# Patient Record
Sex: Male | Born: 1990 | Race: Black or African American | Hispanic: No | Marital: Single | Smoking: Never smoker
Health system: Southern US, Community
[De-identification: ages and names within clinical notes are randomized; demographics above are authoritative.]

## PROBLEM LIST (undated history)

## (undated) DIAGNOSIS — E860 Dehydration: Secondary | ICD-10-CM

## (undated) DIAGNOSIS — F418 Other specified anxiety disorders: Secondary | ICD-10-CM

## (undated) DIAGNOSIS — K920 Hematemesis: Secondary | ICD-10-CM

## (undated) DIAGNOSIS — K5900 Constipation, unspecified: Secondary | ICD-10-CM

## (undated) DIAGNOSIS — R05 Cough: Secondary | ICD-10-CM

## (undated) DIAGNOSIS — T7411XA Adult physical abuse, confirmed, initial encounter: Secondary | ICD-10-CM

## (undated) HISTORY — DX: Hematemesis: K92.0

## (undated) HISTORY — DX: Adult physical abuse, confirmed, initial encounter: T74.11XA

## (undated) HISTORY — DX: Dehydration: E86.0

## (undated) HISTORY — DX: Other specified anxiety disorders: F41.8

## (undated) HISTORY — PX: NO PAST SURGERIES: SHX2092

## (undated) HISTORY — DX: Cough: R05

---
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
## 2013-05-17 ENCOUNTER — Encounter (HOSPITAL_COMMUNITY): Payer: Self-pay | Admitting: Emergency Medicine

## 2013-05-17 ENCOUNTER — Emergency Department (INDEPENDENT_AMBULATORY_CARE_PROVIDER_SITE_OTHER)
Admission: EM | Admit: 2013-05-17 | Discharge: 2013-05-17 | Disposition: A | Discharge status: Home / Self Care | Payer: Managed Care, Other (non HMO) | Source: Home / Self Care | Attending: Family Medicine | Admitting: Family Medicine

## 2013-05-17 DIAGNOSIS — A088 Other specified intestinal infections: Secondary | ICD-10-CM

## 2013-05-17 DIAGNOSIS — A084 Viral intestinal infection, unspecified: Secondary | ICD-10-CM

## 2013-05-17 MED ORDER — LOPERAMIDE HCL 2 MG PO CAPS: 2.0000 mg | ORAL_CAPSULE | Freq: Four times a day (QID) | ORAL | Status: DC | PRN

## 2013-05-17 MED ORDER — ONDANSETRON 4 MG PO TBDP: 4.0000 mg | ORAL_TABLET | Freq: Four times a day (QID) | ORAL | Status: DC | PRN

## 2013-05-17 NOTE — ED Notes (Signed)
Pt c/o cold sxs onset Tuesday C/o having abd pain w/diarrhea and vomiting... Has vomited once today and once yest Also c/o feeling light headed and decreased appetite Denies: fevers Alert w/no signs of acuate distress.

## 2013-05-17 NOTE — ED Provider Notes (Signed)
Medical screening examination/treatment/procedure(s) were performed by resident physician or non-physician practitioner and as supervising physician I was immediately available for consultation/collaboration.   Evertte Sones DOUGLAS MD.   Saba Neuman D Carnelia Oscar, MD 05/17/13 1702 

## 2013-05-17 NOTE — ED Provider Notes (Signed)
CSN: [id number]     Arrival date & time 05/17/13  1121 History   First MD Initiated Contact with Patient 05/17/13 1232     Chief Complaint  Patient presents with  . URI   (Consider location/radiation/quality/duration/timing/severity/associated sxs/prior Treatment) HPI Comments: 22 year old male presents complaining of nausea, vomiting, diarrhea. This started 3 days ago with diarrhea. Last night, he began to feel nauseous and has vomited multiple times. Has not had an appetite but has been drinking lots of fluids and does not feel dehydrated. He did feel slightly lightheaded this morning. No blood in vomitus or diarrhea. No recent travel or sick contacts. No fever  Patient is a 22 y.o. male presenting with URI.  URI Presenting symptoms: no cough, no fatigue, no fever and no sore throat   Associated symptoms: no arthralgias, no myalgias and no neck pain     History reviewed. No pertinent past medical history. History reviewed. No pertinent past surgical history. No family history on file. History  Substance Use Topics  . Smoking status: Never Smoker   . Smokeless tobacco: Not on file  . Alcohol Use: Yes    Review of Systems  Constitutional: Negative for fever, chills and fatigue.  HENT: Negative for sore throat.   Eyes: Negative for visual disturbance.  Respiratory: Negative for cough and shortness of breath.   Cardiovascular: Negative for chest pain, palpitations and leg swelling.  Gastrointestinal: Positive for nausea, vomiting and diarrhea. Negative for abdominal pain, constipation, blood in stool and anal bleeding.  Genitourinary: Negative for dysuria, urgency, frequency and hematuria.  Musculoskeletal: Negative for arthralgias, myalgias, neck pain and neck stiffness.  Skin: Negative for rash.  Neurological: Negative for dizziness, weakness and light-headedness.    Allergies  Review of patient's allergies indicates no known allergies.  Home Medications   Current  Outpatient Rx  Name  Route  Sig  Dispense  Refill  . loperamide (IMODIUM) 2 MG capsule   Oral   Take 1 capsule (2 mg total) by mouth 4 (four) times daily as needed for diarrhea or loose stools.   12 capsule   0   . ondansetron (ZOFRAN-ODT) 4 MG disintegrating tablet   Oral   Take 1 tablet (4 mg total) by mouth every 6 (six) hours as needed for nausea. PRN for nausea or vomiting   12 tablet   0    BP 104/66  Pulse 84  Temp(Src) 98.9 F (37.2 C) (Oral)  Resp 16  SpO2 100% Physical Exam  Nursing note and vitals reviewed. Constitutional: He is oriented to person, place, and time. He appears well-developed and well-nourished. No distress.  HENT:  Head: Normocephalic.  Cardiovascular: Normal rate and regular rhythm.   Pulmonary/Chest: Effort normal. No respiratory distress.  Abdominal: Soft. There is no tenderness.  Neurological: He is alert and oriented to person, place, and time. Coordination normal.  Skin: Skin is warm and dry. No rash noted. He is not diaphoretic.  Psychiatric: He has a normal mood and affect. Judgment normal.    ED Course  Procedures (including critical care time) Labs Review Labs Reviewed - No data to display Imaging Review No results found.    MDM   1. Viral gastroenteritis    Treat with Zofran and Imodium. Followup if not improving as expected.   Meds ordered this encounter  Medications  . ondansetron (ZOFRAN-ODT) 4 MG disintegrating tablet    Sig: Take 1 tablet (4 mg total) by mouth every 6 (six) hours as needed for nausea. PRN  for nausea or vomiting    Dispense:  12 tablet    Refill:  0    Order Specific Question:  Supervising Provider    Answer:  Linna Hoff [phone]  . loperamide (IMODIUM) 2 MG capsule    Sig: Take 1 capsule (2 mg total) by mouth 4 (four) times daily as needed for diarrhea or loose stools.    Dispense:  12 capsule    Refill:  0    Order Specific Question:  Supervising Provider    Answer:  Bradd Canary D [[id number]]        Graylon Good, PA-C 05/17/13 1320

## 2013-07-29 ENCOUNTER — Encounter (HOSPITAL_COMMUNITY): Payer: Self-pay | Admitting: Emergency Medicine

## 2013-07-29 ENCOUNTER — Other Ambulatory Visit (HOSPITAL_COMMUNITY)
Admission: RE | Admit: 2013-07-29 | Discharge: 2013-07-29 | Disposition: A | Discharge status: Home / Self Care | Payer: Managed Care, Other (non HMO) | Source: Ambulatory Visit | Attending: Family Medicine | Admitting: Family Medicine

## 2013-07-29 ENCOUNTER — Emergency Department (INDEPENDENT_AMBULATORY_CARE_PROVIDER_SITE_OTHER)
Admission: EM | Admit: 2013-07-29 | Discharge: 2013-07-29 | Disposition: A | Discharge status: Home / Self Care | Payer: Managed Care, Other (non HMO) | Source: Home / Self Care | Attending: Family Medicine | Admitting: Family Medicine

## 2013-07-29 DIAGNOSIS — Z113 Encounter for screening for infections with a predominantly sexual mode of transmission: Secondary | ICD-10-CM | POA: Insufficient documentation

## 2013-07-29 DIAGNOSIS — N342 Other urethritis: Secondary | ICD-10-CM

## 2013-07-29 DIAGNOSIS — R197 Diarrhea, unspecified: Secondary | ICD-10-CM

## 2013-07-29 LAB — HIV ANTIBODY (ROUTINE TESTING W REFLEX): HIV: NONREACTIVE

## 2013-07-29 LAB — RPR: RPR: NONREACTIVE

## 2013-07-29 MED ORDER — LIDOCAINE HCL (PF) 1 % IJ SOLN
INTRAMUSCULAR | Status: AC
  Filled 2013-07-29: qty 5

## 2013-07-29 MED ORDER — AZITHROMYCIN 250 MG PO TABS
1000.0000 mg | ORAL_TABLET | Freq: Once | ORAL | Status: AC
  Administered 2013-07-29: 1000 mg via ORAL

## 2013-07-29 MED ORDER — CEFTRIAXONE SODIUM 250 MG IJ SOLR
INTRAMUSCULAR | Status: AC
  Filled 2013-07-29: qty 250

## 2013-07-29 MED ORDER — AZITHROMYCIN 250 MG PO TABS
ORAL_TABLET | ORAL | Status: AC
  Filled 2013-07-29: qty 4

## 2013-07-29 MED ORDER — CEFTRIAXONE SODIUM 250 MG IJ SOLR
250.0000 mg | Freq: Once | INTRAMUSCULAR | Status: AC
  Administered 2013-07-29: 250 mg via INTRAMUSCULAR

## 2013-07-29 NOTE — ED Notes (Signed)
C/o diarrhea for two months off/on states having problems since getting over stomach flu.  Pt aslo voices concerns for std check.  Noticed penile discharge yesterday.  Denies any pelvic or abdominal pain.

## 2013-07-29 NOTE — ED Provider Notes (Addendum)
Samuel JanskyKweisi D Townsend is a 23 y.o. male who presents to Urgent Care today for  1) diarrhea off-and-on for the past 2 months. Patient typically has loose stools in the mornings and normal stools during the day. He is approximately 3 or 4 bowel movements daily. He denies any bladder pain. He feels well otherwise. No significant change in his diet. [facility] was [facility] negative.  2) penile discharge. This is been present for one day. He denies any pain or burning with urination he feels well otherwise. He is concerned he may have contracted sex or transmitted infection...   He is a male who has sex with men. His last HIV test was about 3 months ago. He typically practices safe sex.   History reviewed. No pertinent past medical history. History  Substance Use Topics  . Smoking status: Never Smoker   . Smokeless tobacco: Not on file  . Alcohol Use: Yes   ROS as above Medications: No current facility-administered medications for this encounter.   Current Outpatient Prescriptions  Medication Sig Dispense Refill  . loperamide (IMODIUM) 2 MG capsule Take 1 capsule (2 mg total) by mouth 4 (four) times daily as needed for diarrhea or loose stools.  12 capsule  0    Exam:  BP 125/78  Pulse 64  Temp(Src) 98.2 F (36.8 C) (Oral)  Resp 16  SpO2 98% Gen: Well NAD HEENT: EOMI,  MMM Lungs: Normal work of breathing. CTABL Heart: RRR no MRG Abd: NABS, Soft. NT, ND Exts: Brisk capillary refill, warm and well perfused.  Genitals: No inguinal lymphadenopathy. Testicles are descended bilaterally and nontender.  Penis is circumcised and normal appearing without any lesions or masses. Mild clear discharge. Nontender.   Assessment and Plan: 23 y.o. male with  1) diarrhea: Unclear etiology. Recommended patient avoid lactose. Followup with primary care provider. We'll treat empirically with ceftriaxone and azithromycin 2) urethritis: Concerning for gonorrhea or  chlamydia. Urine cytology pending. Additionally have obtained HIV and RPR panel.  Discussed warning signs or symptoms. Please see discharge instructions. Patient expresses understanding.    Rodolph BongEvan S Jozlin Bently, MD 07/29/13 1140  Rodolph BongEvan S Ebrahim Deremer, MD 07/29/13 1140

## 2013-07-29 NOTE — ED Notes (Signed)
Will discharge at 11:35. Pt given injection and waiting blood draw.  Mw,cma

## 2013-07-29 NOTE — Discharge Instructions (Signed)
Thank you for coming in today. I will call you with the results if abnormal. Try avoiding milk products for a month. Get your stool culture results.  Followup with a regular Dr..  If your belly pain worsens, or you have high fever, bad vomiting, blood in your stool or black tarry stool go to the Emergency Room.   Chronic Diarrhea Diarrhea is frequent loose and watery bowel movements. It can cause you to feel weak and dehydrated. Dehydration can cause you to become tired and thirsty and to have a dry mouth, decreased urination, and dark yellow urine. Diarrhea is a sign of another problem, most often an infection that will not last long. In most cases, diarrhea lasts 2 3 days. Diarrhea that lasts longer than 4 weeks is called long-lasting (chronic) diarrhea. It is important to treat your diarrhea as directed by your health care provider to lessen or prevent future episodes of diarrhea.  CAUSES  There are many causes of chronic diarrhea. The following are some possible causes:   Gastrointestinal infections caused by viruses, bacteria, or parasites.   Food poisoning or food allergies.   Certain medicines, such as antibiotics, chemotherapy, and laxatives.   Artificial sweeteners and fructose.   Digestive disorders, such as celiac disease and inflammatory bowel diseases.   Irritable bowel syndrome.  Some disorders of the pancreas.  Disorders of the thyroid.  Reduced blood flow to the intestines.  Cancer. Sometimes the cause of chronic diarrhea is unknown. RISK FACTORS  Having a severely weakened immune system, such as from HIV or AIDS.   Taking certain types of cancer-fighting drugs (such as with chemotherapy) or other medicines.   Having had a recent organ transplant.   Having a portion of the stomach or small bowel removed.   Traveling to countries where food and water supplies are often contaminated.  SYMPTOMS  In addition to frequent, loose stools, diarrhea may  cause:   Cramping.   Abdominal pain.   Nausea.   Fever.  Fatigue.  Urgent need to use the bathroom.  Loss of bowel control. DIAGNOSIS  Your health care provider must take a careful history and perform a physical exam. Tests given are based on your symptoms and history. Tests may include:   Blood or stool tests. Three or more stool samples may be examined. Stool cultures may be used to test for bacteria or parasites.   X-rays.   A procedure in which a thin tube is inserted into the mouth or rectum (endoscopy). This allows the health care provider to look inside the intestine.  TREATMENT   Treatment is aimed at correcting the cause of the diarrhea when possible.  Diarrhea caused by an infection can often be treated with antibiotics.   Diarrhea not caused by an infection may require you to take long-term medicine or have surgery. Specific treatment should be discussed with your health care provider.   If the cause cannot be determined, treatment aims to relieve symptoms and prevent dehydration. Serious health problems can occur if you do not maintain proper fluid levels. Treatment may include:   Taking an oral rehydration solution (ORS) .  Not drinking beverages that contain caffeine (such as tea, coffee, and soft drinks).   Not drinking alcohol.   Maintaining well-balanced nutrition to help you recover faster. HOME CARE INSTRUCTIONS   Drink enough fluids to keep urine clear or pale yellow. Drink 1 cup (8 oz) of fluid for each diarrhea episode. Avoid fluids that contain simple sugars, fruit juices,  whole milk products, and sodas. Hydrate with an ORS. You may purchase the ORS or prepare it at home by mixing the following ingredients together:     tsp (1.7 3  mL) table salt.    tsp (3  mL) baking soda.    tsp (1.7 mL) salt substitute containing potassium chloride.   1 tbsp (20 mL) sugar.   4.2 c (1 L) of water.   Certain foods and beverages may  increase the speed at which food moves through the gastrointestinal (GI) tract. These foods and beverages should be avoided. They include:   Caffeinated and alcoholic beverages.   High-fiber foods, such as raw fruits and vegetables, nuts, seeds, and whole grain breads and cereals.   Foods and beverages sweetened with sugar alcohols, such as xylitol, sorbitol, and mannitol.   Some foods may be well tolerated and may help thicken stool. These include:  Starchy foods, such as rice, toast, pasta, low-sugar cereal, oatmeal, grits, baked potatoes, crackers, and bagels.   Bananas.   Applesauce.   Add probiotic-rich foods to help increase healthy bacteria in the GI tract. These include yogurt and fermented milk products.   Wash your hands well after each diarrhea episode.   Only take over-the-counter or prescription medicines as directed by your health care provider.   Take a warm bath to relieve any burning or pain from frequent diarrhea episodes. SEEK MEDICAL CARE IF:   You are not urinating as often.  Your urine is a dark color.   You become very tired or dizzy.   You have severe pain in the abdomen or rectum.   Your have blood or pus in your stools.   Your stools look black and tarry.  SEEK IMMEDIATE MEDICAL CARE IF:   You are unable to keep fluids down.   You have persistent vomiting.   You have blood in your stool.  Your stools are black and tarry.   You do not urinate in 6 8 hours, or there is only a small amount of very dark urine.   You have abdominal pain that increases or localizes.   You have weakness, dizziness, confusion, or lightheadedness.   You have a severe headache.   Your diarrhea gets worse or does not get better.   You have a fever or persistent symptoms for more than 2 3 days.   You have a fever and your symptoms suddenly get worse.  MAKE SURE YOU:   Understand these instructions.  Will watch your  condition.  Will get help right away if you are not doing well or get worse. Document Released: 09/03/2003 Document Revised: 02/13/2013 Document Reviewed: 12/06/2012 [facility] Patient Information 2014 [place], [place].

## 2013-07-31 ENCOUNTER — Telehealth (HOSPITAL_COMMUNITY): Payer: Self-pay | Admitting: *Deleted
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
## 2013-07-31 NOTE — ED Notes (Signed)
GC and Trich neg., Chlamydia pos., HIV/RPR non-reactive.  Pt. adequately treated with Zithromax and Rocephin.  I called pt. and left a  Message to call. Call 1.  DHHS [facility]. Vassie MoselleYork, Nezzie Manera M 07/31/2013

## 2013-08-23 ENCOUNTER — Emergency Department (HOSPITAL_COMMUNITY)
Admission: EM | Admit: 2013-08-23 | Discharge: 2013-08-24 | Disposition: A | Discharge status: Home / Self Care | Payer: Managed Care, Other (non HMO) | Attending: Emergency Medicine | Admitting: Emergency Medicine

## 2013-08-23 DIAGNOSIS — Y9389 Activity, other specified: Secondary | ICD-10-CM | POA: Insufficient documentation

## 2013-08-23 DIAGNOSIS — T148XXA Other injury of unspecified body region, initial encounter: Secondary | ICD-10-CM

## 2013-08-23 DIAGNOSIS — IMO0002 Reserved for concepts with insufficient information to code with codable children: Secondary | ICD-10-CM | POA: Insufficient documentation

## 2013-08-23 DIAGNOSIS — Y92410 Unspecified street and highway as the place of occurrence of the external cause: Secondary | ICD-10-CM | POA: Insufficient documentation

## 2013-08-23 DIAGNOSIS — S0990XA Unspecified injury of head, initial encounter: Secondary | ICD-10-CM | POA: Insufficient documentation

## 2013-08-24 ENCOUNTER — Encounter (HOSPITAL_COMMUNITY): Payer: Self-pay | Admitting: Emergency Medicine

## 2013-08-24 ENCOUNTER — Emergency Department (HOSPITAL_COMMUNITY): Payer: Managed Care, Other (non HMO)

## 2013-08-24 MED ORDER — IBUPROFEN 800 MG PO TABS
800.0000 mg | ORAL_TABLET | Freq: Once | ORAL | Status: AC
  Administered 2013-08-24: 800 mg via ORAL
  Filled 2013-08-24: qty 1

## 2013-08-24 MED ORDER — IBUPROFEN 800 MG PO TABS: 800.0000 mg | ORAL_TABLET | Freq: Three times a day (TID) | ORAL | Status: DC
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
## 2013-08-24 NOTE — ED Provider Notes (Signed)
Medical screening examination/treatment/procedure(s) were performed by non-physician practitioner and as supervising physician I was immediately available for consultation/collaboration.   Kwasi Joung, MD 08/24/13 0616 
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
## 2013-11-28 ENCOUNTER — Emergency Department (INDEPENDENT_AMBULATORY_CARE_PROVIDER_SITE_OTHER)
Admission: EM | Admit: 2013-11-28 | Discharge: 2013-11-28 | Disposition: A | Discharge status: Home / Self Care | Payer: Managed Care, Other (non HMO) | Source: Home / Self Care

## 2013-11-28 ENCOUNTER — Other Ambulatory Visit (HOSPITAL_COMMUNITY)
Admission: RE | Admit: 2013-11-28 | Discharge: 2013-11-28 | Disposition: A | Discharge status: Home / Self Care | Payer: Managed Care, Other (non HMO) | Source: Ambulatory Visit | Attending: Emergency Medicine | Admitting: Emergency Medicine

## 2013-11-28 ENCOUNTER — Encounter (HOSPITAL_COMMUNITY): Payer: Self-pay | Admitting: Emergency Medicine

## 2013-11-28 DIAGNOSIS — Z113 Encounter for screening for infections with a predominantly sexual mode of transmission: Secondary | ICD-10-CM | POA: Insufficient documentation

## 2013-11-28 DIAGNOSIS — R300 Dysuria: Secondary | ICD-10-CM

## 2013-11-28 LAB — POCT URINALYSIS DIP (DEVICE)
BILIRUBIN URINE: NEGATIVE
Glucose, UA: NEGATIVE mg/dL
Hgb urine dipstick: NEGATIVE
KETONES UR: NEGATIVE mg/dL
Leukocytes, UA: NEGATIVE
Nitrite: NEGATIVE
PH: 7 (ref 5.0–8.0)
PROTEIN: 30 mg/dL — AB
SPECIFIC GRAVITY, URINE: 1.025 (ref 1.005–1.030)
Urobilinogen, UA: 0.2 mg/dL (ref 0.0–1.0)

## 2013-11-28 LAB — HIV ANTIBODY (ROUTINE TESTING W REFLEX): HIV 1&2 Ab, 4th Generation: NONREACTIVE

## 2013-11-28 MED ORDER — CEPHALEXIN 500 MG PO CAPS: 500.0000 mg | ORAL_CAPSULE | Freq: Three times a day (TID) | ORAL | Status: DC

## 2013-11-28 NOTE — ED Provider Notes (Signed)
  Chief Complaint   Chief Complaint  Patient presents with  . Exposure to STD    History of Present Illness   Samuel Townsend is a 23 year old male who has had a three-day history of mild dysuria and some tingling around the urethra. He denies any discharge. The patient has had a history of Chlamydia and gonorrhea, but states he has not been sexually active in over a month. He denies any penile lesions, inguinal adenopathy, testicular pain, tenderness, mass, or abdominal pain. He has had no fever, chills, sore throat, redness of the eyes, skin rash, or joint pain.  Review of Systems   Other than as noted above, the patient denies any of the following symptoms: General:  No fever or chills. GI:  No abdominal pain, back pain, nausea, or vomiting. GU: Hematuria, urethral discharge, penile lesions, penile pain, testicular pain, swelling, or mass, or inguinal lymphadenopathy.  PMFSH   Past medical history, family history, social history, meds, and allergies were reviewed.    Physical Exam    Vital signs:  BP 119/72  Pulse 71  Temp(Src) 98 F (36.7 C) (Oral)  Resp 12  SpO2 100% Gen:  Alert, oriented, in no distress. Lungs:  Clear to auscultation, no wheezes, rales or rhonchi. Heart:  Regular rhythm, no gallop or murmer. Abdomen:  Flat and soft.  No tenderness to palpation, guarding, or rebound.  No hepato-splenomegaly or mass.  Bowel sounds were normally active.  No hernia. Genital exam:  Normal exam. No urethral discharge. No penile lesions, no inguinal adenopathy, testes are nontender without any masses. Back:  No CVA tenderness.  Skin:  Clear, warm and dry.  Labs   Results for orders placed during the hospital encounter of 11/28/13  POCT URINALYSIS DIP (DEVICE)      Result Value Ref Range   Glucose, UA NEGATIVE  NEGATIVE mg/dL   Bilirubin Urine NEGATIVE  NEGATIVE   Ketones, ur NEGATIVE  NEGATIVE mg/dL   Specific Gravity, Urine 1.025  1.005 - 1.030   Hgb urine dipstick  NEGATIVE  NEGATIVE   pH 7.0  5.0 - 8.0   Protein, ur 30 (*) NEGATIVE mg/dL   Urobilinogen, UA 0.2  0.0 - 1.0 mg/dL   Nitrite NEGATIVE  NEGATIVE   Leukocytes, UA NEGATIVE  NEGATIVE    Urine culture obtained as well as urine DNA probes for gonorrhea, Chlamydia, and Trichomonas. At patient's request an HIV serology was also obtained.  Assessment   The encounter diagnosis was Dysuria.   Differential diagnosis is urethritis versus cystitis.  Plan   1.  Meds:  The following meds were prescribed:   New Prescriptions   CEPHALEXIN (KEFLEX) 500 MG CAPSULE    Take 1 capsule (500 mg total) by mouth 3 (three) times daily.    2.  Patient Education/Counseling:  The patient was given appropriate handouts, self care instructions, and instructed in symptomatic relief.  The patient was told he would be called if any of the tests come back positive.  3.  Follow up:  The patient was told to follow up here if no better in 3 to 4 days, or sooner if becoming worse in any way, and given some red flag symptoms such as fever, persistent vomiting, pain, or difficulty urinating which would prompt immediate return.        Reuben Likes, MD 11/28/13 [phone]

## 2013-11-28 NOTE — ED Notes (Signed)
Pt    Reports   Symptoms  Of        tigngling  Sensation  On  Urination  With  Some  Burning on  Urination as  Well  With  Symptoms  For  sev  Days   Pt  Reports   No  Discharge          No   Lesions           Had  Std  5-6 months  Ago   Was  Treated

## 2013-11-28 NOTE — Discharge Instructions (Signed)

## 2013-11-29 ENCOUNTER — Telehealth (HOSPITAL_COMMUNITY): Payer: Self-pay | Admitting: Emergency Medicine

## 2013-11-29 MED ORDER — AZITHROMYCIN 500 MG PO TABS: 1000.0000 mg | ORAL_TABLET | Freq: Every day | ORAL | Status: DC

## 2013-11-29 NOTE — ED Notes (Signed)
The patient's DNA probe came back positive for Chlamydia, negative for gonorrhea. His HIV test was negative. His urine culture is still pending. The patient was called and informed of this positive result. He was informed to let all his sexual partners in the last 3 months know about this and make sure that they get treated and tested as well. A prescription will be sent into the [facility] pharmacy on [address] for azithromycin 500 mg, #2, to take 2 tablets at one time. This will be reported to the health department.  Reuben Likes, MD 11/29/13 2119

## 2013-11-30 LAB — URINE CULTURE
COLONY COUNT: NO GROWTH
Culture: NO GROWTH
Special Requests: NORMAL

## 2013-12-01 NOTE — ED Notes (Signed)
Urine culture: No growth.  Samuel Townsend 12/01/2013

## 2013-12-01 NOTE — ED Notes (Signed)
Patient informed of chlamydia report findings, DHHS form [phone] faxed to [facility]. UA culture still pending

## 2014-03-13 ENCOUNTER — Encounter (HOSPITAL_COMMUNITY): Payer: Self-pay | Admitting: Emergency Medicine

## 2014-03-13 ENCOUNTER — Emergency Department (INDEPENDENT_AMBULATORY_CARE_PROVIDER_SITE_OTHER)
Admission: EM | Admit: 2014-03-13 | Discharge: 2014-03-13 | Disposition: A | Discharge status: Home / Self Care | Payer: Managed Care, Other (non HMO) | Source: Home / Self Care

## 2014-03-13 DIAGNOSIS — R0982 Postnasal drip: Secondary | ICD-10-CM

## 2014-03-13 DIAGNOSIS — J9801 Acute bronchospasm: Secondary | ICD-10-CM

## 2014-03-13 MED ORDER — ALBUTEROL SULFATE HFA 108 (90 BASE) MCG/ACT IN AERS: INHALATION_SPRAY | RESPIRATORY_TRACT | Status: DC

## 2014-03-13 MED ORDER — PREDNISONE 20 MG PO TABS: ORAL_TABLET | ORAL | Status: DC

## 2014-03-13 NOTE — ED Provider Notes (Signed)
CSN: [id number]     Arrival date & time 03/13/14  1547 History   First MD Initiated Contact with Patient 03/13/14 1556     Chief Complaint  Patient presents with  . URI   (Consider location/radiation/quality/duration/timing/severity/associated sxs/prior Treatment) HPI Comments: C/o dry cough for 3 weeks. No relieved by Delsym. Denies PND, Earache, sore throat or fever.    History reviewed. No pertinent past medical history. History reviewed. No pertinent past surgical history. No family history on file. History  Substance Use Topics  . Smoking status: Never Smoker   . Smokeless tobacco: Not on file  . Alcohol Use: Yes    Review of Systems  Constitutional: Negative for fever, diaphoresis, activity change and fatigue.  HENT: Negative for ear pain, facial swelling, postnasal drip, rhinorrhea, sore throat and trouble swallowing.   Eyes: Negative for pain, discharge and redness.  Respiratory: Positive for cough. Negative for chest tightness and shortness of breath.   Cardiovascular: Negative.   Gastrointestinal: Negative.   Musculoskeletal: Negative.  Negative for neck pain and neck stiffness.  Neurological: Negative.     Allergies  Coconut fatty acids  Home Medications   Prior to Admission medications   Medication Sig Start Date End Date Taking? Authorizing Provider  Dextromethorphan Polistirex (DELSYM PO) Take by mouth.   Yes Historical Provider, MD  albuterol (PROVENTIL HFA;VENTOLIN HFA) 108 (90 BASE) MCG/ACT inhaler Inhale 2 puffs into lungs q 4h prn cough or wheeze 03/13/14   Hayden Rasmussen, NP  ibuprofen (ADVIL,MOTRIN) 800 MG tablet Take 1 tablet (800 mg total) by mouth 3 (three) times daily. 08/24/13   Phill Mutter Dammen, PA-C  predniSONE (DELTASONE) 20 MG tablet Take 3 tabs po on first day, 2 tabs second day, 2 tabs third day, 1 tab fourth day, 1 tab 5th day. Take with food. 03/13/14   Hayden Rasmussen, NP   BP 132/71  Temp(Src) 99.1 F (37.3 C) (Oral)  Resp 18  SpO2 96% Physical  Exam  Nursing note and vitals reviewed. Constitutional: He is oriented to person, place, and time. He appears well-developed and well-nourished. No distress.  HENT:  Right Ear: External ear normal.  Left Ear: External ear normal.  Mouth/Throat: No oropharyngeal exudate.  OP with minor erythema and curtains of frothy white PND   Eyes: Conjunctivae and EOM are normal.  Neck: Normal range of motion. Neck supple.  Cardiovascular: Normal rate, regular rhythm and normal heart sounds.   Pulmonary/Chest: Effort normal and breath sounds normal. No respiratory distress.  End expiratory wheeze with forced expiration and cough. Good air movement.  Musculoskeletal: Normal range of motion. He exhibits no edema.  Lymphadenopathy:    He has no cervical adenopathy.  Neurological: He is alert and oriented to person, place, and time.  Skin: Skin is warm and dry. No rash noted.  Psychiatric: He has a normal mood and affect.    ED Course  Procedures (including critical care time) Labs Review Labs Reviewed - No data to display  Imaging Review No results found.   MDM   1. Cough due to bronchospasm   2. PND (post-nasal drip)     Albuterol HFA as dir Allegra daily Prednisone taper dose Lots of liquids      Hayden Rasmussen, NP 03/13/14 1616

## 2014-03-13 NOTE — Discharge Instructions (Signed)
Bronchospasm Also use Allegra 60 mg twice a day for drainage A bronchospasm is when the tubes that carry air in and out of your lungs (airways) spasm or tighten. During a bronchospasm it is hard to breathe. This is because the airways get smaller. A bronchospasm can be triggered by:  Allergies. These may be to animals, pollen, food, or mold.  Infection. This is a common cause of bronchospasm.  Exercise.  Irritants. These include pollution, cigarette smoke, strong odors, aerosol sprays, and paint fumes.  Weather changes.  Stress.  Being emotional. HOME CARE   Always have a plan for getting help. Know when to call your doctor and local emergency services (911 in the [place] Know where you can get emergency care.  Only take medicines as told by your doctor.  If you were prescribed an inhaler or nebulizer machine, ask your doctor how to use it correctly. Always use a spacer with your inhaler if you were given one.  Stay calm during an attack. Try to relax and breathe more slowly.  Control your home environment:  Change your heating and air conditioning filter at least once a month.  Limit your use of fireplaces and wood stoves.  Do not  smoke. Do not  allow smoking in your home.  Avoid perfumes and fragrances.  Get rid of pests (such as roaches and mice) and their droppings.  Throw away plants if you see mold on them.  Keep your house clean and dust free.  Replace carpet with wood, tile, or vinyl flooring. Carpet can trap dander and dust.  Use allergy-proof pillows, mattress covers, and box spring covers.  Wash bed sheets and blankets every week in hot water. Dry them in a dryer.  Use blankets that are made of polyester or cotton.  Wash hands frequently. GET HELP IF:  You have muscle aches.  You have chest pain.  The thick spit you spit or cough up (sputum) changes from clear or white to yellow, green, gray, or bloody.  The thick spit you spit or cough up gets  thicker.  There are problems that may be related to the medicine you are given such as:  A rash.  Itching.  Swelling.  Trouble breathing. GET HELP RIGHT AWAY IF:  You feel you cannot breathe or catch your breath.  You cannot stop coughing.  Your treatment is not helping you breathe better.  You have very bad chest pain. MAKE SURE YOU:   Understand these instructions.  Will watch your condition.  Will get help right away if you are not doing well or get worse. Document Released: 04/10/2009 Document Revised: 06/18/2013 Document Reviewed: 12/04/2012 [facility] Patient Information 2015 [place], [place]. This information is not intended to replace advice given to you by your health care provider. Make sure you discuss any questions you have with your health care provider.  How to Use an Inhaler Using your inhaler correctly is very important. Good technique will make sure that the medicine reaches your lungs.  HOW TO USE AN INHALER: 1. Take the cap off the inhaler. 2. If this is the first time using your inhaler, you need to prime it. Shake the inhaler for 5 seconds. Release four puffs into the air, away from your face. Ask your doctor for help if you have questions. 3. Shake the inhaler for 5 seconds. 4. Turn the inhaler so the bottle is above the mouthpiece. 5. Put your pointer finger on top of the bottle. Your thumb holds the bottom  of the inhaler. 6. Open your mouth. 7. Either hold the inhaler away from your mouth (the width of 2 fingers) or place your lips tightly around the mouthpiece. Ask your doctor which way to use your inhaler. 8. Breathe out as much air as possible. 9. Breathe in and push down on the bottle 1 time to release the medicine. You will feel the medicine go in your mouth and throat. 10. Continue to take a deep breath in very slowly. Try to fill your lungs. 11. After you have breathed in completely, hold your breath for 10 seconds. This will help the medicine to  settle in your lungs. If you cannot hold your breath for 10 seconds, hold it for as long as you can before you breathe out. 12. Breathe out slowly, through pursed lips. Whistling is an example of pursed lips. 13. If your doctor has told you to take more than 1 puff, wait at least 15-30 seconds between puffs. This will help you get the best results from your medicine. Do not use the inhaler more than your doctor tells you to. 14. Put the cap back on the inhaler. 15. Follow the directions from your doctor or from the inhaler package about cleaning the inhaler. If you use more than one inhaler, ask your doctor which inhalers to use and what order to use them in. Ask your doctor to help you figure out when you will need to refill your inhaler.  If you use a steroid inhaler, always rinse your mouth with water after your last puff, gargle and spit out the water. Do not swallow the water. GET HELP IF:  The inhaler medicine only partially helps to stop wheezing or shortness of breath.  You are having trouble using your inhaler.  You have some increase in thick spit (phlegm). GET HELP RIGHT AWAY IF:  The inhaler medicine does not help your wheezing or shortness of breath or you have tightness in your chest.  You have dizziness, headaches, or fast heart rate.  You have chills, fever, or night sweats.  You have a large increase of thick spit, or your thick spit is bloody. MAKE SURE YOU:   Understand these instructions.  Will watch your condition.  Will get help right away if you are not doing well or get worse. Document Released: 03/22/2008 Document Revised: 04/03/2013 Document Reviewed: 01/10/2013 [facility] Patient Information 2015 [place], [place]. This information is not intended to replace advice given to you by your health care provider. Make sure you discuss any questions you have with your health care provider.

## 2014-03-13 NOTE — ED Provider Notes (Signed)
Medical screening examination/treatment/procedure(s) were performed by non-physician practitioner and as supervising physician I was immediately available for consultation/collaboration.  Leslee Home, M.D.  Reuben Likes, MD 03/13/14 [phone]

## 2014-03-13 NOTE — ED Notes (Signed)
Patient has c/o dry cough that started about three weeks ago.  Denies any sputum production, denies sore throat, denies ear pain

## 2014-08-19 ENCOUNTER — Emergency Department (HOSPITAL_COMMUNITY)
Admission: EM | Admit: 2014-08-19 | Discharge: 2014-08-19 | Disposition: A | Discharge status: Home / Self Care | Payer: 59 | Source: Home / Self Care | Attending: Family Medicine | Admitting: Family Medicine

## 2014-08-19 ENCOUNTER — Other Ambulatory Visit (HOSPITAL_COMMUNITY)
Admission: RE | Admit: 2014-08-19 | Discharge: 2014-08-19 | Disposition: A | Discharge status: Home / Self Care | Payer: 59 | Source: Ambulatory Visit | Attending: Family Medicine | Admitting: Family Medicine

## 2014-08-19 ENCOUNTER — Encounter (HOSPITAL_COMMUNITY): Payer: Self-pay | Admitting: *Deleted

## 2014-08-19 DIAGNOSIS — R369 Urethral discharge, unspecified: Secondary | ICD-10-CM

## 2014-08-19 DIAGNOSIS — Z113 Encounter for screening for infections with a predominantly sexual mode of transmission: Secondary | ICD-10-CM | POA: Insufficient documentation

## 2014-08-19 MED ORDER — AZITHROMYCIN 250 MG PO TABS
1000.0000 mg | ORAL_TABLET | Freq: Once | ORAL | Status: AC
  Administered 2014-08-19: 1000 mg via ORAL

## 2014-08-19 MED ORDER — LIDOCAINE HCL (PF) 1 % IJ SOLN
INTRAMUSCULAR | Status: AC
  Filled 2014-08-19: qty 5

## 2014-08-19 MED ORDER — AZITHROMYCIN 250 MG PO TABS
ORAL_TABLET | ORAL | Status: AC
  Filled 2014-08-19: qty 4

## 2014-08-19 MED ORDER — CEFTRIAXONE SODIUM 250 MG IJ SOLR
INTRAMUSCULAR | Status: AC
  Filled 2014-08-19: qty 250

## 2014-08-19 MED ORDER — CEFTRIAXONE SODIUM 250 MG IJ SOLR
250.0000 mg | Freq: Once | INTRAMUSCULAR | Status: AC
  Administered 2014-08-19: 250 mg via INTRAMUSCULAR

## 2014-08-19 NOTE — ED Notes (Signed)
Pt  Has  Symptoms  Of  A  Discharge  From his  Penis  As   Well  As  Tingling  When  He  Urinates         Pt  Reports    Symptoms  X   1  Week

## 2014-08-19 NOTE — ED Provider Notes (Signed)
CSN: [id number]     Arrival date & time 08/19/14  1525 History   First MD Initiated Contact with Patient 08/19/14 1544     Chief Complaint  Patient presents with  . SEXUALLY TRANSMITTED DISEASE   (Consider location/radiation/quality/duration/timing/severity/associated sxs/prior Treatment) HPI Comments: 24 year old male complaining of a penile discharge for 6 days. Denies associated symptoms such as burning or other urinary symptoms. Denies lesions, testicular or scrotal pain or swelling.   History reviewed. No pertinent past medical history. History reviewed. No pertinent past surgical history. History reviewed. No pertinent family history. History  Substance Use Topics  . Smoking status: Never Smoker   . Smokeless tobacco: Not on file  . Alcohol Use: Yes    Review of Systems  Genitourinary: Positive for discharge. Negative for dysuria, urgency, penile swelling, scrotal swelling, penile pain and testicular pain.       As per history of present illness  All other systems reviewed and are negative.   Allergies  Coconut fatty acids  Home Medications   Prior to Admission medications   Not on File   BP 122/78 mmHg  Pulse 72  Temp(Src) 98.6 F (37 C) (Oral)  Resp 14  SpO2 100% Physical Exam  Constitutional: He is oriented to person, place, and time. He appears well-developed and well-nourished. No distress.  Pulmonary/Chest: Effort normal. No respiratory distress.  Genitourinary: No penile tenderness.  Neurological: He is alert and oriented to person, place, and time.  Skin: Skin is warm and dry.  Psychiatric: He has a normal mood and affect.  Nursing note and vitals reviewed.   ED Course  Procedures (including critical care time) Labs Review Labs Reviewed  URINE CYTOLOGY ANCILLARY ONLY    Imaging Review No results found.   MDM   1. Abnormal penile discharge    Urine ancillary testing pending Rocephin 250 mg IM Azithromycin 1 g by mouth   Hayden Rasmussenavid Rylei Codispoti,  NP 08/19/14 1607

## 2014-08-20 LAB — URINE CYTOLOGY ANCILLARY ONLY
Chlamydia: NEGATIVE
Neisseria Gonorrhea: NEGATIVE
Trichomonas: NEGATIVE

## 2014-10-09 ENCOUNTER — Encounter (HOSPITAL_COMMUNITY): Payer: Self-pay | Admitting: Emergency Medicine

## 2014-10-09 ENCOUNTER — Other Ambulatory Visit (HOSPITAL_COMMUNITY)
Admission: RE | Admit: 2014-10-09 | Discharge: 2014-10-09 | Disposition: A | Discharge status: Home / Self Care | Payer: 59 | Source: Ambulatory Visit | Attending: Family Medicine | Admitting: Family Medicine

## 2014-10-09 ENCOUNTER — Emergency Department (HOSPITAL_COMMUNITY)
Admission: EM | Admit: 2014-10-09 | Discharge: 2014-10-09 | Disposition: A | Discharge status: Home / Self Care | Payer: 59 | Source: Home / Self Care | Attending: Family Medicine | Admitting: Family Medicine

## 2014-10-09 DIAGNOSIS — Z202 Contact with and (suspected) exposure to infections with a predominantly sexual mode of transmission: Secondary | ICD-10-CM

## 2014-10-09 DIAGNOSIS — Z113 Encounter for screening for infections with a predominantly sexual mode of transmission: Secondary | ICD-10-CM | POA: Insufficient documentation

## 2014-10-09 LAB — POCT URINALYSIS DIP (DEVICE)
Bilirubin Urine: NEGATIVE
GLUCOSE, UA: NEGATIVE mg/dL
Hgb urine dipstick: NEGATIVE
KETONES UR: NEGATIVE mg/dL
Leukocytes, UA: NEGATIVE
Nitrite: NEGATIVE
Protein, ur: 30 mg/dL — AB
Specific Gravity, Urine: >=1.03 (ref 1.005–1.030)
Urobilinogen, UA: 0.2 mg/dL (ref 0.0–1.0)
pH: 6.5 (ref 5.0–8.0)

## 2014-10-09 MED ORDER — PHENAZOPYRIDINE HCL 100 MG PO TABS: 100.0000 mg | ORAL_TABLET | Freq: Three times a day (TID) | ORAL | Status: DC | PRN

## 2014-10-09 MED ORDER — AZITHROMYCIN 250 MG PO TABS: 1000.0000 mg | ORAL_TABLET | Freq: Once | ORAL | Status: DC

## 2014-10-09 MED ORDER — CEFTRIAXONE SODIUM 250 MG IJ SOLR
INTRAMUSCULAR | Status: AC
  Filled 2014-10-09: qty 250

## 2014-10-09 MED ORDER — CEFTRIAXONE SODIUM 250 MG IJ SOLR
250.0000 mg | Freq: Once | INTRAMUSCULAR | Status: AC
  Administered 2014-10-09: 250 mg via INTRAMUSCULAR

## 2014-10-09 MED ORDER — AZITHROMYCIN 250 MG PO TABS
ORAL_TABLET | ORAL | Status: AC
  Filled 2014-10-09: qty 4

## 2014-10-09 MED ORDER — LIDOCAINE HCL (PF) 1 % IJ SOLN
INTRAMUSCULAR | Status: AC
  Filled 2014-10-09: qty 5

## 2014-10-09 NOTE — ED Notes (Signed)
Reports being treated for std's 2 months ago, pt came in c/o penile discharge.   Pt test result all came back negative.   Denies any unprotected intercourse within the time frame of being seen last.   Mild pelvic discomfort.  Nausea.  And d/c is also noticed with urinating.     No fever.

## 2014-10-09 NOTE — ED Provider Notes (Signed)
CSN: [id number]     Arrival date & time 10/09/14  0849 History   First MD Initiated Contact with Patient 10/09/14 0945     Chief Complaint  Patient presents with  . Penile Discharge   (Consider location/radiation/quality/duration/timing/severity/associated sxs/prior Treatment) HPI  Developed penile pain and discharge 2 mo ago. Daily. Getting worse. Treated for Gonorrhea and chlamydia on 08/19/14. Resolved then started again 2 days ago. Sexually active w/ condoms every time, but does not use condoms with oral sex. Sexual contacts without known STD symptoms. Denies fevers, nausea, vomiting, abdominal pain, frequency, back pain, rash, lymphadenopathy, unintentional weight loss, oral lesions.. Tested for HIV recently which was negative.    History reviewed. No pertinent past medical history. History reviewed. No pertinent past surgical history. Family History  Problem Relation Age of Onset  . Diabetes Neg Hx   . Cancer Neg Hx   . Heart failure Neg Hx   . Hyperlipidemia Neg Hx   . Hypertension Neg Hx    History  Substance Use Topics  . Smoking status: Never Smoker   . Smokeless tobacco: Not on file  . Alcohol Use: Yes    Review of Systems Per HPI with all other pertinent systems negative.   Allergies  Coconut fatty acids  Home Medications   Prior to Admission medications   Medication Sig Start Date End Date Taking? Authorizing Provider  phenazopyridine (PYRIDIUM) 100 MG tablet Take 1-2 tablets (100-200 mg total) by mouth 3 (three) times daily as needed for pain. 10/09/14   Ozella Rocksavid J Kriss Perleberg, MD   BP 151/83 mmHg  Pulse 80  Temp(Src) 98.6 F (37 C)  Resp 16  SpO2 100% Physical Exam Physical Exam  Constitutional: oriented to person, place, and time. appears well-developed and well-nourished. No distress.  HENT:  Head: Normocephalic and atraumatic.  Eyes: EOMI. PERRL.  Neck: Normal range of motion.  Cardiovascular: RRR, no m/r/g, 2+ distal pulses,  Pulmonary/Chest: Effort  normal and breath sounds normal. No respiratory distress.  Abdominal: Soft. Bowel sounds are normal. NonTTP, no distension.  Musculoskeletal: Normal range of motion. Non ttp, no effusion.  Neurological: alert and oriented to person, place, and time.  Skin: Skin is warm. No rash noted. non diaphoretic.  Psychiatric: normal mood and affect. behavior is normal. Judgment and thought content normal.  Genital: penis nml, circumcised, no discharge. No groin lymphadenopathy    ED Course  Procedures (including critical care time) Labs Review Labs Reviewed  POCT URINALYSIS DIP (DEVICE) - Abnormal; Notable for the following:    Protein, ur 30 (*)    All other components within normal limits  HIV ANTIBODY (ROUTINE TESTING)  RPR    Imaging Review No results found.   MDM   1. STD exposure    Recent HIV testing normal. This was a concern this patient has had multiple rounds of gonorrhea treated recommend patient return in 2 weeks for test of cure. Likely transmitting through oral sex. Azithromycin 1000 mg by mouth and ceftriaxone 250 mg IM administered in clinic. Pt provided w/ pyridium Rx for dysuria. UA fairly unremarkable, urine culture sent.    Ozella Rocksavid J Sonyia Muro, MD 10/09/14 1001

## 2014-10-09 NOTE — Discharge Instructions (Signed)
Your symptoms are likely due to a sexually transmitted disease. This probably transmitted due to oral sex. You were treated with the proper medicines in our clinic. We will call you with the results of your lab work. Your sexual contacts will need to be treated as well.

## 2014-10-10 LAB — URINE CYTOLOGY ANCILLARY ONLY
Chlamydia: NEGATIVE
NEISSERIA GONORRHEA: NEGATIVE
TRICH (WINDOWPATH): NEGATIVE

## 2014-11-18 DIAGNOSIS — F418 Other specified anxiety disorders: Secondary | ICD-10-CM | POA: Insufficient documentation

## 2014-11-18 DIAGNOSIS — R636 Underweight: Secondary | ICD-10-CM | POA: Insufficient documentation

## 2014-11-18 HISTORY — DX: Other specified anxiety disorders: F41.8

## 2014-11-18 HISTORY — DX: Underweight: R63.6

## 2014-12-11 DIAGNOSIS — R636 Underweight: Secondary | ICD-10-CM

## 2014-12-11 DIAGNOSIS — F418 Other specified anxiety disorders: Secondary | ICD-10-CM

## 2014-12-11 DIAGNOSIS — Z7251 High risk heterosexual behavior: Secondary | ICD-10-CM

## 2014-12-22 ENCOUNTER — Ambulatory Visit: Payer: 59 | Admitting: Internal Medicine

## 2015-12-16 ENCOUNTER — Emergency Department (HOSPITAL_COMMUNITY)
Admission: EM | Admit: 2015-12-16 | Discharge: 2015-12-16 | Disposition: A | Discharge status: Home / Self Care | Payer: 59 | Attending: Dermatology | Admitting: Dermatology

## 2015-12-16 ENCOUNTER — Encounter (HOSPITAL_COMMUNITY): Payer: Self-pay | Admitting: Emergency Medicine

## 2015-12-16 DIAGNOSIS — R197 Diarrhea, unspecified: Secondary | ICD-10-CM | POA: Insufficient documentation

## 2015-12-16 DIAGNOSIS — R05 Cough: Secondary | ICD-10-CM | POA: Insufficient documentation

## 2015-12-16 DIAGNOSIS — R112 Nausea with vomiting, unspecified: Secondary | ICD-10-CM | POA: Insufficient documentation

## 2015-12-16 DIAGNOSIS — R6883 Chills (without fever): Secondary | ICD-10-CM | POA: Insufficient documentation

## 2015-12-16 DIAGNOSIS — Z5321 Procedure and treatment not carried out due to patient leaving prior to being seen by health care provider: Secondary | ICD-10-CM | POA: Insufficient documentation

## 2015-12-16 LAB — CBC
HCT: 43.6 % (ref 39.0–52.0)
HEMOGLOBIN: 14.6 g/dL (ref 13.0–17.0)
MCH: 27.5 pg (ref 26.0–34.0)
MCHC: 33.5 g/dL (ref 30.0–36.0)
MCV: 82.1 fL (ref 78.0–100.0)
Platelets: 249 10*3/uL (ref 150–400)
RBC: 5.31 MIL/uL (ref 4.22–5.81)
RDW: 12.4 % (ref 11.5–15.5)
WBC: 5.5 10*3/uL (ref 4.0–10.5)

## 2015-12-16 LAB — URINALYSIS, ROUTINE W REFLEX MICROSCOPIC
Bilirubin Urine: NEGATIVE
Glucose, UA: NEGATIVE mg/dL
Hgb urine dipstick: NEGATIVE
KETONES UR: NEGATIVE mg/dL
Leukocytes, UA: NEGATIVE
NITRITE: NEGATIVE
Protein, ur: NEGATIVE mg/dL
Specific Gravity, Urine: 1.01 (ref 1.005–1.030)
pH: 7 (ref 5.0–8.0)

## 2015-12-16 LAB — COMPREHENSIVE METABOLIC PANEL
ALBUMIN: 4.2 g/dL (ref 3.5–5.0)
ALK PHOS: 42 U/L (ref 38–126)
ALT: 18 U/L (ref 17–63)
ANION GAP: 7 (ref 5–15)
AST: 30 U/L (ref 15–41)
BILIRUBIN TOTAL: 1 mg/dL (ref 0.3–1.2)
BUN: 8 mg/dL (ref 6–20)
CALCIUM: 9.3 mg/dL (ref 8.9–10.3)
CO2: 25 mmol/L (ref 22–32)
Chloride: 106 mmol/L (ref 101–111)
Creatinine, Ser: 0.99 mg/dL (ref 0.61–1.24)
GFR calc Af Amer: >60 mL/min (ref >60)
GLUCOSE: 112 mg/dL — AB (ref 65–99)
Potassium: 3.4 mmol/L — ABNORMAL LOW (ref 3.5–5.1)
Sodium: 138 mmol/L (ref 135–145)
TOTAL PROTEIN: 7 g/dL (ref 6.5–8.1)

## 2015-12-16 LAB — LIPASE, BLOOD: Lipase: 27 U/L (ref 11–51)

## 2015-12-16 NOTE — ED Notes (Signed)
Pt. reports persistent nausea , emesis and diarrhea with chills and dry cough onset today .

## 2015-12-16 NOTE — ED Notes (Signed)
Pt called several times for room, no response.

## 2015-12-17 ENCOUNTER — Ambulatory Visit (INDEPENDENT_AMBULATORY_CARE_PROVIDER_SITE_OTHER): Payer: 59 | Admitting: Family Medicine

## 2015-12-17 VITALS — BP 116/68 | HR 75 | Temp 98.0°F | Resp 17 | Ht 74.0 in | Wt 129.0 lb

## 2015-12-17 DIAGNOSIS — R05 Cough: Secondary | ICD-10-CM | POA: Diagnosis not present

## 2015-12-17 DIAGNOSIS — R059 Cough, unspecified: Secondary | ICD-10-CM

## 2015-12-17 MED ORDER — ALBUTEROL SULFATE HFA 108 (90 BASE) MCG/ACT IN AERS: 2.0000 | INHALATION_SPRAY | RESPIRATORY_TRACT | Status: DC | PRN

## 2015-12-17 NOTE — Progress Notes (Signed)
   Subjective:    Patient ID: Samuel JanskyKweisi D Strout, male    DOB: Nov 01, 1990, 25 y.o.   MRN: [id number]  HPI This is a pleasant 25 yo male who presents today with 1 month of non productive cough. Seemed to start after allergy symptoms which have mostly resolved. Feels like congestion in upper part of chest. Little runny nose. Never fever. No sore throat, no ear pain, no SOB, no wheeze. No history of asthma. Has had problems in the past with lingering cough and used albuterol with good relief. He has recently tried Mucinex, delsym without relief. Cough triggered by talking during the day and going in and out of air conditioning. Not interrupting sleep. Energy level good. Never smoked. He went to ED yesterday after having an episode of emesis, but did not stay due to wait time. Labs unremarkable. No further emesis.   History reviewed. No pertinent past medical history. History reviewed. No pertinent past surgical history. Family History  Problem Relation Age of Onset  . Heart failure Neg Hx   . Hypertension Neg Hx   . Sarcoidosis Mother   . Diabetes Father   . Cancer Maternal Grandmother     lung and metastatic  . Diabetes Maternal Grandmother   . Heart disease Maternal Grandmother   . Hyperlipidemia Maternal Grandmother   . Stroke Paternal Grandmother    Social History  Substance Use Topics  . Smoking status: Never Smoker   . Smokeless tobacco: None  . Alcohol Use: 1.8 oz/week    3 Glasses of wine per week     Comment: weekends      Review of Systems Per HPI    Objective:   Physical Exam Physical Exam  Constitutional: Oriented to person, place, and time. He appears well-developed and well-nourished.  HENT:  Head: Normocephalic and atraumatic.  Eyes: Conjunctivae are normal.  Neck: Normal range of motion. Neck supple.  Cardiovascular: Normal rate, regular rhythm and normal heart sounds.   Pulmonary/Chest: Effort normal and breath sounds normal.  Musculoskeletal: Normal range of  motion.  Neurological: Alert and oriented to person, place, and time.  Skin: Skin is warm and dry.  Psychiatric: Normal mood and affect. Behavior is normal. Judgment and thought content normal.  Vitals reviewed.  BP 116/68 mmHg  Pulse 75  Temp(Src) 98 F (36.7 C) (Oral)  Resp 17  Ht 6\' 2"  (1.88 m)  Wt 129 lb (58.514 kg)  BMI 16.56 kg/m2  SpO2 99% Wt Readings from Last 3 Encounters:  12/17/15 129 lb (58.514 kg)  12/16/15 128 lb 4 oz (58.174 kg)  08/23/13 130 lb (58.968 kg)       Assessment & Plan:  1. Cough - sounds like post viral or allergic cyclical cough - instructed patient on inhaler use, to use every 2-4 hours while awake for the next two days then every 4 hours as needed - avoid triggers, use cough suppressants, stay hydrated - albuterol (PROVENTIL HFA;VENTOLIN HFA) 108 (90 Base) MCG/ACT inhaler; Inhale 2 puffs into the lungs every 4 (four) hours as needed for wheezing or shortness of breath (cough, shortness of breath or wheezing.).  Dispense: 1 Inhaler; Refill: 1 - RTC precautions reviewed  Olean Reeeborah Lamekia Nolden, FNP-BC  [facility]  12/21/2015 6:47 PM

## 2015-12-17 NOTE — Patient Instructions (Addendum)
  Use 2 puffs every 2-4 hrs for the next 24 hours, then as need for cough and chest tightness Use cough lozenges as needed Avoid triggers   IF you received an x-ray today, you will receive an invoice from [facility]. Please contact [facility] at [phone] with questions or concerns regarding your invoice.   IF you received labwork today, you will receive an invoice from [facility]. Please contact [facility] at [phone] with questions or concerns regarding your invoice.   Our billing staff will not be able to assist you with questions regarding bills from these companies.  You will be contacted with the lab results as soon as they are available. The fastest way to get your results is to activate your My Chart account. Instructions are located on the last page of this paperwork. If you have not heard from us regarding the results in 2 weeks, please contact this office.    Cough, Adult A cough helps to clear your throat and lungs. A cough may last only 2-3 weeks (acute), or it may last longer than 8 weeks (chronic). Many different things can cause a cough. A cough may be a sign of an illness or another medical condition. HOME CARE  Pay attention to any changes in your cough.  Take medicines only as told by your doctor.  If you were prescribed an antibiotic medicine, take it as told by your doctor. Do not stop taking it even if you start to feel better.  Talk with your doctor before you try using a cough medicine.  Drink enough fluid to keep your pee (urine) clear or pale yellow.  If the air is dry, use a cold steam vaporizer or humidifier in your home.  Stay away from things that make you cough at work or at home.  If your cough is worse at night, try using extra pillows to raise your head up higher while you sleep.  Do not smoke, and try not to be around smoke. If you need help quitting, ask your doctor.  Do not have  caffeine.  Do not drink alcohol.  Rest as needed. GET HELP IF:  You have new problems (symptoms).  You cough up yellow fluid (pus).  Your cough does not get better after 2-3 weeks, or your cough gets worse.  Medicine does not help your cough and you are not sleeping well.  You have pain that gets worse or pain that is not helped with medicine.  You have a fever.  You are losing weight and you do not know why.  You have night sweats. GET HELP RIGHT AWAY IF:  You cough up blood.  You have trouble breathing.  Your heartbeat is very fast.   This information is not intended to replace advice given to you by your health care provider. Make sure you discuss any questions you have with your health care provider.   Document Released: 02/24/2011 Document Revised: 03/04/2015 Document Reviewed: 08/20/2014 Elsevier Interactive Patient Education 2016 [employer]    Nn

## 2015-12-17 NOTE — Progress Notes (Signed)
Subjective:    Patient ID: Samuel JanskyKweisi D Townsend, male    DOB: Oct 09, 1990, 25 y.o.   MRN: [id number]  Chief Complaint  Patient presents with  . Cough    1 MONTH    HPI  Patient presents today with a cough x 1.5 months.  He believes it began after his allergies, and settled as congestion in his chest.  He describes a dry cough, with occasional mucus production, and minimal sputum.  Intermittent coughing episodes throughout the day usually occuring while leaving his house for work, being in the office and after dinner before bed.  He has tried delsym and mucinex extra strength, which supresses his cough but has not improved his chest congestion.  Yesterday he had nausea, 1 episode of vomiting up soup and mucous, and 1 episode of diarrhea.  Denies N, V, D today.  Denies sleep interruption, fever, chest pain, SOB, dyspnes, head congestion, rhinorrhea, PND, heartburn, and indigestion.  Drinking lots of fluids - 2-1L bottle, 2- 16ozavian  Never smoker  Denies sick contacts  No history of asthma.  Mother diagnosed last year with Sarcoidosis.  Review of Systems  Constitutional: Negative for fever, chills, diaphoresis, activity change, appetite change and unexpected weight change.  HENT: Positive for sneezing. Negative for congestion, ear discharge, ear pain, facial swelling, hearing loss, postnasal drip, rhinorrhea, sinus pressure, sore throat and trouble swallowing.   Eyes: Negative for photophobia, pain, discharge, redness, itching and visual disturbance.  Respiratory: Positive for cough and wheezing (auditory with coughing). Negative for apnea, chest tightness and shortness of breath.   Cardiovascular: Negative for chest pain and palpitations.  Gastrointestinal: Positive for nausea, vomiting and diarrhea. Negative for abdominal pain, constipation and abdominal distention.  Endocrine: Positive for polydipsia.  Allergic/Immunologic: Positive for environmental allergies.  Neurological:  Negative for dizziness.   Patient Active Problem List   Diagnosis Date Noted  . Situational anxiety 11/18/2014  . Underweight 11/18/2014  . High risk sexual behavior 11/18/2014   Current Outpatient Prescriptions on File Prior to Visit  Medication Sig Dispense Refill  . Multiple Vitamin (MULTIVITAMIN) tablet Take 1 tablet by mouth daily.     No current facility-administered medications on file prior to visit.   Allergies  Allergen Reactions  . Coconut Fatty Acids Itching and Swelling    Tongue swelling       Objective: BP 116/68 mmHg  Pulse 75  Temp(Src) 98 F (36.7 C) (Oral)  Resp 17  Ht 6\' 2"  (1.88 m)  Wt 129 lb (58.514 kg)  BMI 16.56 kg/m2  SpO2 99%   Physical Exam  Constitutional: He appears well-developed and well-nourished.  HENT:  Head: Normocephalic.  Right Ear: Hearing and external ear normal. No drainage or tenderness. A middle ear effusion is present. No decreased hearing is noted.  Left Ear: External ear normal. No drainage or tenderness.  No middle ear effusion. No decreased hearing is noted.  Nose: Nose normal. No mucosal edema, rhinorrhea or sinus tenderness. Right sinus exhibits no maxillary sinus tenderness and no frontal sinus tenderness. Left sinus exhibits no maxillary sinus tenderness and no frontal sinus tenderness.  Mouth/Throat: Oropharynx is clear and moist and mucous membranes are normal. No posterior oropharyngeal edema or posterior oropharyngeal erythema.  Cardiovascular: Normal rate, regular rhythm, normal heart sounds and intact distal pulses.   Pulses:      Radial pulses are 2+ on the right side, and 2+ on the left side.       Dorsalis pedis pulses are 2+ on  the right side, and 2+ on the left side.       Posterior tibial pulses are 2+ on the right side, and 2+ on the left side.  Pulmonary/Chest: Effort normal and breath sounds normal. No accessory muscle usage. No respiratory distress. He has no decreased breath sounds. He has no wheezes. He  has no rhonchi. He has no rales.  Skin: He is not diaphoretic.      Assessment & Plan:  1. Cough - albuterol (PROVENTIL HFA;VENTOLIN HFA) 108 (90 Base) MCG/ACT inhaler; Inhale 2 puffs into the lungs every 4 (four) hours as needed for wheezing or shortness of breath (cough, shortness of breath or wheezing.).  Dispense: 1 Inhaler; Refill: 1  Provided education about cough and management.  Cough likely cyclic due to allergies and bronchospasms.  Prescribed albuterol inhaler and educated on use, recommended 2 puffs be used every 2-4 hours for the next 24 hours then as needed for cough and chest tightness.   Recommended patient use cough lozenges to keep throat moist, and avoid triggers as much as possible.   If symptoms do not improve or worsen please come back for additional evaluation.  Berdell Hostetler P. Kalob Bergen, PA-S

## 2016-01-28 ENCOUNTER — Encounter: Payer: Self-pay | Admitting: Emergency Medicine

## 2016-01-28 ENCOUNTER — Ambulatory Visit (INDEPENDENT_AMBULATORY_CARE_PROVIDER_SITE_OTHER): Payer: 59 | Admitting: Emergency Medicine

## 2016-01-28 ENCOUNTER — Ambulatory Visit (INDEPENDENT_AMBULATORY_CARE_PROVIDER_SITE_OTHER)
Admission: RE | Admit: 2016-01-28 | Discharge: 2016-01-28 | Disposition: A | Discharge status: Home / Self Care | Payer: 59 | Source: Ambulatory Visit | Attending: Emergency Medicine | Admitting: Emergency Medicine

## 2016-01-28 VITALS — BP 112/70 | HR 71 | Ht 75.0 in | Wt 137.0 lb

## 2016-01-28 DIAGNOSIS — R059 Cough, unspecified: Secondary | ICD-10-CM | POA: Insufficient documentation

## 2016-01-28 DIAGNOSIS — R05 Cough: Secondary | ICD-10-CM | POA: Insufficient documentation

## 2016-01-28 DIAGNOSIS — R053 Chronic cough: Secondary | ICD-10-CM | POA: Insufficient documentation

## 2016-01-28 HISTORY — DX: Chronic cough: R05.3

## 2016-01-28 HISTORY — DX: Cough, unspecified: R05.9

## 2016-01-28 MED ORDER — BENZONATATE 200 MG PO CAPS: 200.0000 mg | ORAL_CAPSULE | Freq: Three times a day (TID) | ORAL | 1 refills | Status: DC | PRN

## 2016-01-28 NOTE — Assessment & Plan Note (Signed)
Most likely exacerbating factor here is chronic rhinitis. He is allergic rhinitis had improved so he was able to stop his Zyrtec but then his cough returned. I would like for him to restart the Zyrtec and treat the rhinitis for longer to see if the cough improves again. He doesn't have any GERD symptoms and I will defer empiric GERD therapy at this time although we may decide to do it in the future. He has a history of sarcoidosis in his family (mother) which increases his own risk although there is no evidence to support at this time. We will perform a chest x-ray. Also some in his symptoms, especially sensitivity to cold air and response to albuterol, could be consistent with cough variant asthma. I would like to perform pulmonary function testing to evaluate further. I'll follow-up with him after the PFT and after he's had an opportunity to use the Zyrtec and Tessalon Perles for several weeks.

## 2016-01-28 NOTE — Progress Notes (Signed)
Subjective:    Patient ID: Samuel Townsend, male    DOB: 1990/11/15, 25 y.o.   MRN: [id number]  HPI 25 year old never smoker with little past medical history. Self referral today for evaluation of cough. He reports that last year during the spring he developed nasal congestion and sneeze for the first time. Caused a lot of cough, that got better when allergy season ended. This year he developed similar sx - nasal congestion and drainage, started zyrtec for a while. Then he was able to stop it when the congestion stopped in March. A couple of weeks later cough returned that is sometimes productive. Has used some cough suppressant, some mucinex with temporarily relief. He was given proAir at urgent care, mixed relief. Occasionally a globus sensation. Will be exacerbated with cold air, no associated dyspnea. He very rarely has GERD. Of note his mother has been diagnosed with sarcoidosis. He does not have any relevant inhaled exposures.  Review of Systems  Constitutional: Negative for fever and unexpected weight change.  HENT: Negative for congestion, dental problem, ear pain, nosebleeds, postnasal drip, rhinorrhea, sinus pressure, sneezing, sore throat and trouble swallowing.   Eyes: Negative for redness and itching.  Respiratory: Positive for cough. Negative for chest tightness, shortness of breath and wheezing.   Cardiovascular: Negative for palpitations and leg swelling.  Gastrointestinal: Negative for nausea and vomiting.  Genitourinary: Negative for dysuria.  Musculoskeletal: Negative for joint swelling.  Skin: Negative for rash.  Neurological: Negative for headaches.  Hematological: Does not bruise/bleed easily.  Psychiatric/Behavioral: Negative for dysphoric mood. The patient is not nervous/anxious.     No past medical history on file.   Family History  Problem Relation Age of Onset  . Heart failure Neg Hx   . Hypertension Neg Hx   . Sarcoidosis Mother   . Diabetes Father   .  Mental illness Father     Dissociative Identitiy Disorder  . Cancer Maternal Grandmother     lung and metastatic  . Diabetes Maternal Grandmother   . Heart disease Maternal Grandmother   . Hyperlipidemia Maternal Grandmother   . Stroke Paternal Grandmother      Social History   Social History  . Marital status: Single    Spouse name: N/A  . Number of children: N/A  . Years of education: N/A   Occupational History  . Not on file.   Social History Main Topics  . Smoking status: Never Smoker  . Smokeless tobacco: Never Used  . Alcohol use 1.8 oz/week    3 Glasses of wine per week     Comment: weekends  . Drug use: No  . Sexual activity: Yes    Birth control/ protection: Condom   Other Topics Concern  . Not on file   Social History Narrative  . No narrative on file     Allergies  Allergen Reactions  . Coconut Flavor Itching  . Mushroom Extract Complex      Outpatient Medications Prior to Visit  Medication Sig Dispense Refill  . albuterol (PROVENTIL HFA;VENTOLIN HFA) 108 (90 Base) MCG/ACT inhaler Inhale 2 puffs into the lungs every 4 (four) hours as needed for wheezing or shortness of breath (cough, shortness of breath or wheezing.). 1 Inhaler 1  . Multiple Vitamin (MULTIVITAMIN) tablet Take 1 tablet by mouth daily.     No facility-administered medications prior to visit.         Objective:   Physical Exam Vitals:   01/28/16 1632  BP: 112/70  Pulse: 71  SpO2: 100%  Weight: 137 lb (62.1 kg)  Height:  (1.905 m)   Gen: Pleasant, thin man, in no distress,  normal affect  ENT: No lesions,  mouth clear,  oropharynx clear, no postnasal drip  Neck: No JVD, no TMG, no carotid bruits  Lungs: No use of accessory muscles, clear without rales or rhonchi, he does have some cough with deep inspiration  Cardiovascular: RRR, heart sounds normal, no murmur or gallops, no peripheral edema  Musculoskeletal: No deformities, no cyanosis or clubbing  Neuro: alert,  non focal  Skin: Warm, no lesions or rashes      Assessment & Plan:  Chronic cough Most likely exacerbating factor here is chronic rhinitis. He is allergic rhinitis had improved so he was able to stop his Zyrtec but then his cough returned. I would like for him to restart the Zyrtec and treat the rhinitis for longer to see if the cough improves again. He doesn't have any GERD symptoms and I will defer empiric GERD therapy at this time although we may decide to do it in the future. He has a history of sarcoidosis in his family (mother) which increases his own risk although there is no evidence to support at this time. We will perform a chest x-ray. Also some in his symptoms, especially sensitivity to cold air and response to albuterol, could be consistent with cough variant asthma. I would like to perform pulmonary function testing to evaluate further. I'll follow-up with him after the PFT and after he's had an opportunity to use the Zyrtec and Tessalon Perles for several weeks.  Levy Pupa, MD, PhD 01/28/2016, 5:16 PM [facility] [phone] or if no answer [phone]

## 2016-01-28 NOTE — Patient Instructions (Signed)
CXR today We will perform full pulmonary function testing at your next visit Please restart your zyrtec every day Take albuterol 2 puffs up to every 4 hours if needed for cough or chest tightness Use Tessalon perles up to every 6 hours as needed for cough Follow with Dr Delton Coombes in 8 weeks or sooner if you have any problems

## 2016-02-17 ENCOUNTER — Encounter (HOSPITAL_COMMUNITY): Payer: Self-pay | Admitting: Emergency Medicine

## 2016-02-17 ENCOUNTER — Ambulatory Visit (HOSPITAL_COMMUNITY)
Admission: EM | Admit: 2016-02-17 | Discharge: 2016-02-17 | Disposition: A | Discharge status: Home / Self Care | Payer: 59 | Attending: Physician Assistant | Admitting: Physician Assistant

## 2016-02-17 DIAGNOSIS — S0990XA Unspecified injury of head, initial encounter: Secondary | ICD-10-CM

## 2016-02-17 DIAGNOSIS — R112 Nausea with vomiting, unspecified: Secondary | ICD-10-CM | POA: Diagnosis not present

## 2016-02-17 DIAGNOSIS — R55 Syncope and collapse: Secondary | ICD-10-CM | POA: Diagnosis not present

## 2016-02-17 MED ORDER — ONDANSETRON HCL 4 MG PO TABS: 4.0000 mg | ORAL_TABLET | Freq: Four times a day (QID) | ORAL | 0 refills | Status: DC

## 2016-02-17 NOTE — ED Triage Notes (Signed)
Pt states he was bending over the toilet to vomit when he got dizzy and fell to his left and hit his left temple and leg on the tub.  This occurred about an hour or two ago. Pt denies LOC but states he is still nauseous and dizzy.  He is limping from the pain in his leg and he says the temple is still tender.

## 2016-02-18 ENCOUNTER — Encounter (HOSPITAL_COMMUNITY): Payer: Self-pay | Admitting: *Deleted

## 2016-02-18 ENCOUNTER — Encounter (HOSPITAL_BASED_OUTPATIENT_CLINIC_OR_DEPARTMENT_OTHER): Payer: Self-pay | Admitting: Emergency Medicine

## 2016-02-18 ENCOUNTER — Emergency Department (HOSPITAL_BASED_OUTPATIENT_CLINIC_OR_DEPARTMENT_OTHER): Payer: 59

## 2016-02-18 ENCOUNTER — Emergency Department (HOSPITAL_COMMUNITY)
Admission: EM | Admit: 2016-02-18 | Discharge: 2016-02-18 | Disposition: A | Discharge status: Home / Self Care | Payer: 59 | Source: Home / Self Care

## 2016-02-18 ENCOUNTER — Emergency Department (HOSPITAL_BASED_OUTPATIENT_CLINIC_OR_DEPARTMENT_OTHER)
Admission: EM | Admit: 2016-02-18 | Discharge: 2016-02-18 | Disposition: A | Discharge status: Home / Self Care | Payer: 59 | Attending: Emergency Medicine | Admitting: Emergency Medicine

## 2016-02-18 DIAGNOSIS — R55 Syncope and collapse: Secondary | ICD-10-CM

## 2016-02-18 DIAGNOSIS — W1812XA Fall from or off toilet with subsequent striking against object, initial encounter: Secondary | ICD-10-CM

## 2016-02-18 DIAGNOSIS — Y92002 Bathroom of unspecified non-institutional (private) residence single-family (private) house as the place of occurrence of the external cause: Secondary | ICD-10-CM | POA: Insufficient documentation

## 2016-02-18 DIAGNOSIS — H538 Other visual disturbances: Secondary | ICD-10-CM | POA: Insufficient documentation

## 2016-02-18 DIAGNOSIS — Z5321 Procedure and treatment not carried out due to patient leaving prior to being seen by health care provider: Secondary | ICD-10-CM

## 2016-02-18 DIAGNOSIS — Y999 Unspecified external cause status: Secondary | ICD-10-CM | POA: Insufficient documentation

## 2016-02-18 DIAGNOSIS — S0990XA Unspecified injury of head, initial encounter: Secondary | ICD-10-CM | POA: Diagnosis not present

## 2016-02-18 DIAGNOSIS — R112 Nausea with vomiting, unspecified: Secondary | ICD-10-CM | POA: Insufficient documentation

## 2016-02-18 DIAGNOSIS — R42 Dizziness and giddiness: Secondary | ICD-10-CM

## 2016-02-18 DIAGNOSIS — Y939 Activity, unspecified: Secondary | ICD-10-CM | POA: Insufficient documentation

## 2016-02-18 DIAGNOSIS — R51 Headache: Secondary | ICD-10-CM | POA: Diagnosis not present

## 2016-02-18 DIAGNOSIS — W16212A Fall in (into) filled bathtub causing other injury, initial encounter: Secondary | ICD-10-CM | POA: Diagnosis not present

## 2016-02-18 MED ORDER — NAPROXEN 250 MG PO TABS
500.0000 mg | ORAL_TABLET | Freq: Once | ORAL | Status: AC
  Administered 2016-02-18: 500 mg via ORAL
  Filled 2016-02-18: qty 2

## 2016-02-18 MED ORDER — ONDANSETRON HCL 4 MG/2ML IJ SOLN
4.0000 mg | Freq: Once | INTRAMUSCULAR | Status: DC
  Filled 2016-02-18: qty 2

## 2016-02-18 NOTE — ED Triage Notes (Signed)
Pt was bending over the toilet to vomit when he felt dizzy, fell and hit the L side of his head and L hip on the tub. Has had blurry vision following episode. [facility] gave zofran, reports relief of nausea.

## 2016-02-18 NOTE — ED Provider Notes (Signed)
[facility] Provider Note   CSN: [id number] Arrival date & time: 02/18/16  0356     History   Chief Complaint Chief Complaint  Patient presents with  . Fall    headache related to fall    HPI Samuel Townsend is a 25 y.o. male who had an episode of vomiting yesterday evening about 6 PM. He thinks the vomiting was caused by eating too much. He became lightheaded and fell hitting his left temple and left hip on some bathroom fixtures. He denies loss of consciousness. He was seen at the [facility] urgent care yesterday and his nausea was treated with Zofran. No imaging studies were done. He was advised to return to the ED should his symptoms worsen. He is having worsening pain in his left temple which she rates about an 8 or 9 out of 10. Pain is worse with palpation. He is having some blurred vision. He is also having some pain over his left greater trochanter but is able to bear weight and ambulate.  HPI  History reviewed. No pertinent past medical history.  Patient Active Problem List   Diagnosis Date Noted  . Chronic cough 01/28/2016  . Situational anxiety 11/18/2014  . Underweight 11/18/2014  . High risk sexual behavior 11/18/2014    History reviewed. No pertinent surgical history.     Home Medications    Prior to Admission medications   Medication Sig Start Date End Date Taking? Authorizing Provider  albuterol (PROVENTIL HFA;VENTOLIN HFA) 108 (90 Base) MCG/ACT inhaler Inhale 2 puffs into the lungs every 4 (four) hours as needed for wheezing or shortness of breath (cough, shortness of breath or wheezing.). 12/17/15   Emi Belfasteborah B Gessner, FNP  benzonatate (TESSALON) 200 MG capsule Take 1 capsule (200 mg total) by mouth 3 (three) times daily as needed for cough. 01/28/16   Leslye Peerobert S Byrum, MD  Multiple Vitamin (MULTIVITAMIN) tablet Take 1 tablet by mouth daily.    Historical Provider, MD  ondansetron (ZOFRAN) 4 MG tablet Take 1 tablet (4 mg total) by mouth every 6  (six) hours. 02/17/16   Tharon AquasFrank C Patrick, PA    Family History Family History  Problem Relation Age of Onset  . Sarcoidosis Mother   . Diabetes Father   . Mental illness Father     Dissociative Identitiy Disorder  . Cancer Maternal Grandmother     lung and metastatic  . Diabetes Maternal Grandmother   . Heart disease Maternal Grandmother   . Hyperlipidemia Maternal Grandmother   . Stroke Paternal Grandmother   . Heart failure Neg Hx   . Hypertension Neg Hx     Social History Social History  Substance Use Topics  . Smoking status: Never Smoker  . Smokeless tobacco: Never Used  . Alcohol use 1.8 oz/week    3 Glasses of wine per week     Comment: weekends     Allergies   Coconut flavor and Mushroom extract complex   Review of Systems Review of Systems  All other systems reviewed and are negative.   Physical Exam Updated Vital Signs BP 141/80 (BP Location: Left Arm)   Pulse 66   Temp 98.1 F (36.7 C) (Oral)   Resp 18   Ht 6\' 3"  (1.905 m)   Wt 139 lb (63 kg)   SpO2 100%   BMI 17.37 kg/m   Physical Exam General: Well-developed, well-nourished male in no acute distress; appearance consistent with age of record HENT: normocephalic; tenderness of left temple  without step off or hematoma palpated; no hemotympanum Eyes: pupils equal, round and reactive to light; extraocular muscles intact Neck: supple; nontender Heart: regular rate and rhythm Lungs: clear to auscultation bilaterally Abdomen: soft; nondistended; nontender; no masses or hepatosplenomegaly; bowel sounds present Extremities: No deformity; full range of motion; pulses normal; tenderness over left greater trochanter Neurologic: Awake, alert and oriented; motor function intact in all extremities and symmetric; no facial droop Skin: Warm and dry Psychiatric: Normal mood and affect    ED Treatments / Results  Nursing notes and vitals signs, including pulse oximetry, reviewed.  Summary of this visit's  results, reviewed by myself:  Imaging Studies: Ct Head Wo Contrast  Result Date: 02/18/2016 CLINICAL DATA:  25 y/o M; fell into bathtub striking the temporal region with headache and blurred vision. EXAM: CT HEAD WITHOUT CONTRAST TECHNIQUE: Contiguous axial images were obtained from the base of the skull through the vertex without intravenous contrast. COMPARISON:  None. FINDINGS: Brain: No evidence of acute infarction, hemorrhage, hydrocephalus, extra-axial collection or mass lesion/mass effect. Vascular: No hyperdense vessel or unexpected calcification. Skull: Negative. Sinuses/Orbits: No acute finding. Other: None. IMPRESSION: Normal CT of head. No acute intracranial abnormality. No skull fracture is identified. Electronically Signed   By: Mitzi Hansen M.D.   On: 02/18/2016 05:10      Procedures (including critical care time)   Final Clinical Impressions(s) / ED Diagnoses   Final diagnoses:  Head injury, initial encounter  Vasovagal near syncope      Paula Libra, MD 02/18/16 1610

## 2016-02-18 NOTE — ED Triage Notes (Addendum)
Pt reports while having emesis he fell striking his head and hip against bathtub and is now has prolonged headache with blurred vision. Pt further states was seen at Urgent care the evening of 02/17/2016 just after fall and was told that his exam at that time was negative but if headache worsens or persists to go to the nearest ED for further work up.

## 2016-02-18 NOTE — ED Notes (Addendum)
Pt.has being triage but vitalsigns was not recorded.vitalsign has been taken and recorded.

## 2016-02-19 NOTE — ED Provider Notes (Signed)
CSN: [id number]     Arrival date & time 02/17/16  1911 History   None    Chief Complaint  Patient presents with  . Emesis  . Dizziness  . Fall   (Consider location/radiation/quality/duration/timing/severity/associated sxs/prior Treatment)Patient is a 25 year old male who had an episode of vomiting around 6 PM. He states that he thinks that he ate too much food which caused him to vomit. He states that in the process of vomiting he became lightheaded and fell striking his head against the bathtub or one of the fixtures in the bathroom. Denies loss of consciousness. He states he has pain in the left temple area and left hip. Symptomatic treatment at home. HPIHistory reviewed. No pertinent past medical history. History reviewed. No pertinent surgical history.   Family History  Problem Relation Age of Onset  . Sarcoidosis Mother   . Diabetes Father   . Mental illness Father     Dissociative Identitiy Disorder  . Cancer Maternal Grandmother     lung and metastatic  . Diabetes Maternal Grandmother   . Heart disease Maternal Grandmother   . Hyperlipidemia Maternal Grandmother   . Stroke Paternal Grandmother   . Heart failure Neg Hx   . Hypertension Neg Hx    Social History  Substance Use Topics  . Smoking status: Never Smoker  . Smokeless tobacco: Never Used  . Alcohol use 1.8 oz/week    3 Glasses of wine per week     Comment: weekends    Review of Systems  Allergies  Coconut flavor and Mushroom extract complex  Home Medications   Prior to Admission medications   Medication Sig Start Date End Date Taking? Authorizing Provider  benzonatate (TESSALON) 200 MG capsule Take 1 capsule (200 mg total) by mouth 3 (three) times daily as needed for cough. 01/28/16  Yes Leslye Peerobert S Byrum, MD  albuterol (PROVENTIL HFA;VENTOLIN HFA) 108 (90 Base) MCG/ACT inhaler Inhale 2 puffs into the lungs every 4 (four) hours as needed for wheezing or shortness of breath (cough, shortness of breath or  wheezing.). 12/17/15   Emi Belfasteborah B Gessner, FNP  Multiple Vitamin (MULTIVITAMIN) tablet Take 1 tablet by mouth daily.    Historical Provider, MD  ondansetron (ZOFRAN) 4 MG tablet Take 1 tablet (4 mg total) by mouth every 6 (six) hours. 02/17/16   Tharon AquasFrank C Patrick, PA   Meds Ordered and Administered this Visit  Medications - No data to display  BP 125/57 (BP Location: Right Arm)   Pulse 73   Temp 98.6 F (37 C) (Oral)   Resp 16   Ht 6\' 3"  (1.905 m)   Wt 139 lb (63 kg)   SpO2 100%   BMI 17.37 kg/m  No data found.   Physical Exam NURSES NOTES AND VITAL SIGNS REVIEWED. CONSTITUTIONAL: Well developed, well nourished, no acute distress HEENT: normocephalic, atraumatic< tenderness over the left temple, without swelling, palpable or visible deformity. TM's are clear without hemotympanum EYES: Conjunctiva normal, PERRL NECK:normal ROM, supple, no adenopathy PULMONARY:No respiratory distress, normal effort ABDOMINAL: Soft, ND, NT BS+, No CVAT MUSCULOSKELETAL: Normal ROM of all extremities, left hip is mildly tender laterally. Patient is weightbearing with a limp.  SKIN: warm and dry without rash PSYCHIATRIC: Mood and affect, behavior are normal NEUROLOGICAL SCREENING EXAM: Constitutional:  oriented to person, place, and time.  Neurological:  .  normal strength and normal reflexes. No cranial nerve deficit or sensory deficit. negative Romberg sign. GCS eye subscore is 4. GCS verbal subscore is 5. GCS motor  subscore is 6.    Urgent Care Course   Clinical Course    Procedures (including critical care time)  Labs Review Labs Reviewed - No data to display  Imaging Review Ct Head Wo Contrast  Result Date: 02/18/2016 CLINICAL DATA:  25 y/o M; fell into bathtub striking the temporal region with headache and blurred vision. EXAM: CT HEAD WITHOUT CONTRAST TECHNIQUE: Contiguous axial images were obtained from the base of the skull through the vertex without intravenous contrast. COMPARISON:   None. FINDINGS: Brain: No evidence of acute infarction, hemorrhage, hydrocephalus, extra-axial collection or mass lesion/mass effect. Vascular: No hyperdense vessel or unexpected calcification. Skull: Negative. Sinuses/Orbits: No acute finding. Other: None. IMPRESSION: Normal CT of head. No acute intracranial abnormality. No skull fracture is identified. Electronically Signed   By: Mitzi Hansen M.D.   On: 02/18/2016 05:10     Visual Acuity Review  Right Eye Distance:   Left Eye Distance:   Bilateral Distance:    Right Eye Near:   Left Eye Near:    Bilateral Near:        25 year old male with most likely vasovagal episode. There is no indication at this time that imaging studies need to be done. Patient had no loss of consciousness. He along with his mother are advised that if symptoms worsen that he should go to the emergency department for evaluation. As he may need to have a scan done. The just symptomatic treatment at this time including cold compresses Zofran for nausea Tylenol, ibuprofen for discomfort. MDM   1. Vaso vagal episode   2. Head injury, initial encounter   3. Non-intractable vomiting with nausea, vomiting of unspecified type     Patient is reassured that there are no issues that require transfer to higher level of care at this time or additional tests. Patient is advised to continue home symptomatic treatment. Patient is advised that if there are new or worsening symptoms to attend the emergency department, contact primary care provider, or return to UC. Instructions of care provided discharged home in stable condition.    THIS NOTE WAS GENERATED USING A VOICE RECOGNITION SOFTWARE PROGRAM. ALL REASONABLE EFFORTS  WERE MADE TO PROOFREAD THIS DOCUMENT FOR ACCURACY.  I have verbally reviewed the discharge instructions with the patient. A printed AVS was given to the patient.  All questions were answered prior to discharge.      Tharon Aquas,  PA 02/19/16 1910

## 2016-02-22 ENCOUNTER — Emergency Department (HOSPITAL_BASED_OUTPATIENT_CLINIC_OR_DEPARTMENT_OTHER): Payer: 59

## 2016-02-22 ENCOUNTER — Encounter (HOSPITAL_BASED_OUTPATIENT_CLINIC_OR_DEPARTMENT_OTHER): Payer: Self-pay | Admitting: *Deleted

## 2016-02-22 ENCOUNTER — Emergency Department (HOSPITAL_BASED_OUTPATIENT_CLINIC_OR_DEPARTMENT_OTHER)
Admission: EM | Admit: 2016-02-22 | Discharge: 2016-02-23 | Disposition: A | Discharge status: Home / Self Care | Payer: 59 | Attending: Emergency Medicine | Admitting: Emergency Medicine

## 2016-02-22 DIAGNOSIS — K5900 Constipation, unspecified: Secondary | ICD-10-CM | POA: Diagnosis not present

## 2016-02-22 DIAGNOSIS — K839 Disease of biliary tract, unspecified: Secondary | ICD-10-CM | POA: Diagnosis not present

## 2016-02-22 DIAGNOSIS — Z79899 Other long term (current) drug therapy: Secondary | ICD-10-CM | POA: Insufficient documentation

## 2016-02-22 DIAGNOSIS — R1011 Right upper quadrant pain: Secondary | ICD-10-CM

## 2016-02-22 DIAGNOSIS — K824 Cholesterolosis of gallbladder: Secondary | ICD-10-CM | POA: Diagnosis not present

## 2016-02-22 DIAGNOSIS — R112 Nausea with vomiting, unspecified: Secondary | ICD-10-CM | POA: Diagnosis not present

## 2016-02-22 DIAGNOSIS — K828 Other specified diseases of gallbladder: Secondary | ICD-10-CM

## 2016-02-22 HISTORY — DX: Constipation, unspecified: K59.00

## 2016-02-22 LAB — CBC WITH DIFFERENTIAL/PLATELET
Basophils Absolute: 0 10*3/uL (ref 0.0–0.1)
Basophils Relative: 1 %
Eosinophils Absolute: 0.1 10*3/uL (ref 0.0–0.7)
Eosinophils Relative: 1 %
HEMATOCRIT: 45.2 % (ref 39.0–52.0)
Hemoglobin: 15.4 g/dL (ref 13.0–17.0)
LYMPHS ABS: 2.8 10*3/uL (ref 0.7–4.0)
LYMPHS PCT: 43 %
MCH: 28 pg (ref 26.0–34.0)
MCHC: 34.1 g/dL (ref 30.0–36.0)
MCV: 82.2 fL (ref 78.0–100.0)
MONOS PCT: 13 %
Monocytes Absolute: 0.8 10*3/uL (ref 0.1–1.0)
NEUTROS PCT: 42 %
Neutro Abs: 2.7 10*3/uL (ref 1.7–7.7)
Platelets: 240 10*3/uL (ref 150–400)
RBC: 5.5 MIL/uL (ref 4.22–5.81)
RDW: 12.4 % (ref 11.5–15.5)
WBC: 6.4 10*3/uL (ref 4.0–10.5)

## 2016-02-22 LAB — COMPREHENSIVE METABOLIC PANEL
ALT: 19 U/L (ref 17–63)
AST: 23 U/L (ref 15–41)
Albumin: 4.4 g/dL (ref 3.5–5.0)
Alkaline Phosphatase: 44 U/L (ref 38–126)
Anion gap: 5 (ref 5–15)
BUN: 14 mg/dL (ref 6–20)
CO2: 29 mmol/L (ref 22–32)
Calcium: 9.2 mg/dL (ref 8.9–10.3)
Chloride: 103 mmol/L (ref 101–111)
Creatinine, Ser: 0.98 mg/dL (ref 0.61–1.24)
Glucose, Bld: 89 mg/dL (ref 65–99)
POTASSIUM: 4.4 mmol/L (ref 3.5–5.1)
Sodium: 137 mmol/L (ref 135–145)
Total Bilirubin: 1.1 mg/dL (ref 0.3–1.2)
Total Protein: 7.5 g/dL (ref 6.5–8.1)

## 2016-02-22 LAB — LIPASE, BLOOD: Lipase: 22 U/L (ref 11–51)

## 2016-02-22 MED ORDER — SODIUM CHLORIDE 0.9 % IV BOLUS (SEPSIS)
1000.0000 mL | Freq: Once | INTRAVENOUS | Status: AC
  Administered 2016-02-22: 1000 mL via INTRAVENOUS

## 2016-02-22 MED ORDER — ONDANSETRON HCL 4 MG/2ML IJ SOLN
4.0000 mg | Freq: Once | INTRAMUSCULAR | Status: AC
  Administered 2016-02-22: 4 mg via INTRAVENOUS
  Filled 2016-02-22: qty 2

## 2016-02-22 MED ORDER — MORPHINE SULFATE (PF) 4 MG/ML IV SOLN
4.0000 mg | Freq: Once | INTRAVENOUS | Status: AC
  Administered 2016-02-22: 4 mg via INTRAVENOUS
  Filled 2016-02-22: qty 1

## 2016-02-22 NOTE — ED Triage Notes (Signed)
Vomiting. Constipation. Seen 4 days ago for same.

## 2016-02-22 NOTE — ED Provider Notes (Signed)
[facility] Note   CSN: [id number] Arrival date & time: 02/22/16  1927  By signing my name below, I, Phillis Haggis, attest that this documentation has been prepared under the direction and in the presence of Mattie Marlin, PA-C. Electronically Signed: Phillis Haggis, ED Scribe. 02/22/16. 10:49 PM.   History   Chief Complaint Chief Complaint  Patient presents with  . Abdominal Pain  . Emesis   The history is provided by the patient. No language interpreter was used.   HPI Comments: RALPHAEL SOUTHGATE is a 25 y.o. male who presents to the Emergency Department complaining of gradually worsening nausea onset two weeks ago. Pt reports associated constipation, throbbing, non-radiating, intermittent RLQ abdominal pain that begins Before vomiting, and after vomiting. Pt currently rates his pain 12/10. Pt states that his nausea that worsens in the evening after dinner. Patient states he vomits only in the evening with his dinner. He does not vomit with breakfast or lunch. Pt states that he experienced a fall due to lightheadedness and was evaluated in the ED. He was prescribed Zofran and Naproxen for his symptoms with a negative CT head scan performed.  Pt states that he has been taking Miralax for his constipation to no relief. Pt denies fever, chills, trouble swallowing, hematochezia, diarrhea, dysuria, or hematuria. Pt states that he is currently being evaluated for a persistent cough by a pulmonologist. He has been taking albuterol and tessalon for those symptoms. Pt was previously on ProAir  Past Medical History:  Diagnosis Date  . Constipation     Patient Active Problem List   Diagnosis Date Noted  . Chronic cough 01/28/2016  . Situational anxiety 11/18/2014  . Underweight 11/18/2014  . High risk sexual behavior 11/18/2014    History reviewed. No pertinent surgical history.     Home Medications    Prior to Admission medications   Medication Sig Start Date  End Date Taking? Authorizing Provider  benzonatate (TESSALON) 200 MG capsule Take 1 capsule (200 mg total) by mouth 3 (three) times daily as needed for cough. 01/28/16  Yes Leslye Peer, MD  albuterol (PROVENTIL HFA;VENTOLIN HFA) 108 (90 Base) MCG/ACT inhaler Inhale 2 puffs into the lungs every 4 (four) hours as needed for wheezing or shortness of breath (cough, shortness of breath or wheezing.). 12/17/15   Emi Belfast, FNP  Multiple Vitamin (MULTIVITAMIN) tablet Take 1 tablet by mouth daily.    Historical Provider, MD  ondansetron (ZOFRAN) 4 MG tablet Take 1 tablet (4 mg total) by mouth every 6 (six) hours. 02/23/16   Jerre Simon, PA    Family History Family History  Problem Relation Age of Onset  . Sarcoidosis Mother   . Diabetes Father   . Mental illness Father     Dissociative Identitiy Disorder  . Cancer Maternal Grandmother     lung and metastatic  . Diabetes Maternal Grandmother   . Heart disease Maternal Grandmother   . Hyperlipidemia Maternal Grandmother   . Stroke Paternal Grandmother   . Heart failure Neg Hx   . Hypertension Neg Hx     Social History Social History  Substance Use Topics  . Smoking status: Never Smoker  . Smokeless tobacco: Never Used  . Alcohol use 1.8 oz/week    3 Glasses of wine per week     Comment: weekends     Allergies   Coconut flavor and Mushroom extract complex   Review of Systems Review of Systems  Constitutional: Negative for chills  and fever.  HENT: Negative for trouble swallowing.   Gastrointestinal: Positive for abdominal pain, constipation, nausea and vomiting. Negative for blood in stool and diarrhea.  Genitourinary: Negative for dysuria and hematuria.     Physical Exam Updated Vital Signs BP 115/83 (BP Location: Left Arm)   Pulse 86   Temp 98.1 F (36.7 C) (Oral)   Resp 18   Ht 6\' 3"  (1.905 m)   Wt 139 lb (63 kg)   SpO2 100%   BMI 17.37 kg/m   Physical Exam  Constitutional: He appears well-developed and  well-nourished. No distress.  HENT:  Head: Normocephalic and atraumatic.  Eyes: Conjunctivae are normal.  Cardiovascular: Normal rate and regular rhythm.   Pulmonary/Chest: Effort normal and breath sounds normal. No respiratory distress.  Abdominal: Soft. Bowel sounds are increased. There is tenderness in the right upper quadrant. There is positive Murphy's sign. There is no CVA tenderness.  Musculoskeletal: Normal range of motion.  Neurological: He is alert. Coordination normal.  Skin: Skin is warm and dry. He is not diaphoretic.  Psychiatric: He has a normal mood and affect. His behavior is normal.  Nursing note and vitals reviewed.  ED Treatments / Results  DIAGNOSTIC STUDIES: Oxygen Saturation is 100% on RA, normal by my interpretation.    COORDINATION OF CARE: 10:46 PM-Discussed treatment plan which includes labs and CT scan with pt at bedside and pt agreed to plan.    Labs (all labs ordered are listed, but only abnormal results are displayed) Labs Reviewed  COMPREHENSIVE METABOLIC PANEL  CBC WITH DIFFERENTIAL/PLATELET  LIPASE, BLOOD    EKG  EKG Interpretation None       Radiology [place] Abdomen Complete  Result Date: 02/22/2016 CLINICAL DATA:  Right upper quadrant pain, nausea and vomiting for 2 weeks. EXAM: ABDOMEN ULTRASOUND COMPLETE COMPARISON:  None. FINDINGS: Gallbladder: Gallbladder is somewhat decompressed without significant wall thickening. Murphy's sign is positive. Echogenic polyps are demonstrated along the nondependent gallbladder wall measuring up to 2 mm diameter. Based on size, these are likely benign. Small amount of sludge in the gallbladder but no stones are visualized. Common bile duct: Diameter: 1.1 mm, normal Liver: No focal lesion identified. Within normal limits in parenchymal echogenicity. IVC: No abnormality visualized. Pancreas: Visualized portion unremarkable. Spleen: Size and appearance within normal limits. Right Kidney: Length: 11.1 cm.  Echogenicity within normal limits. No mass or hydronephrosis visualized. Left Kidney: Length: 11 cm. Echogenicity within normal limits. No mass or hydronephrosis visualized. Abdominal aorta: No aneurysm visualized. Other findings: None. IMPRESSION: Small polyps in the gallbladder, likely benign. Gallbladder sludge without stones. Murphy's sign is positive. Findings are indeterminate for cholecystitis. No bile duct dilatation. Electronically Signed   By: Burman NievesWilliam  Stevens M.D.   On: 02/22/2016 23:52    Procedures Procedures (including critical care time)  Medications Ordered in ED Medications  sodium chloride 0.9 % bolus 1,000 mL (0 mLs Intravenous Stopped 02/23/16 0032)  morphine 4 MG/ML injection 4 mg (4 mg Intravenous Given 02/22/16 2337)  ondansetron (ZOFRAN) injection 4 mg (4 mg Intravenous Given 02/22/16 2337)     Initial Impression / Assessment and Plan / ED Course  I have reviewed the triage vital signs and the nursing notes.  Pertinent labs & imaging results that were available during my care of the patient were reviewed by me and considered in my medical decision making (see chart for details).  Clinical Course   Patient is nontoxic, nonseptic appearing, in no apparent distress.  Patient's pain and other symptoms adequately  managed in emergency department.  Fluid bolus given.  Labs, imaging and vitals reviewed.  Patient does not meet the SIRS or Sepsis criteria.  Patient afebrile, labs unremarkable, VSS. On repeat exam patient does not have a surgical abdomin and there are no peritoneal signs.  No indication of appendicitis, bowel obstruction, bowel perforation, cholecystitis, diverticulitis. Patient's ultrasound reviewed by me revealed polyps and sludge in the gallbladder without definitive cholecystitis. Pt states he has an appointment with [facility] tomorrow.   Patient discharged home with symptomatic treatment and given strict instructions for follow-up with their primary  care physician and to keep his appointment with GI tomorrow.  I have also discussed reasons to return immediately to the ER.  Patient expresses understanding and agrees with plan.  Pt case discussed at pt seen by Dr. Read Drivers who agrees with the above plan  I personally performed the services described in this documentation, which was scribed in my presence. The recorded information has been reviewed and is accurate.   Final Clinical Impressions(s) / ED Diagnoses   Final diagnoses:  Gallbladder sludge  Right upper quadrant pain  Non-intractable vomiting with nausea, vomiting of unspecified type    New Prescriptions Current Discharge Medication List       Jerre Simon, [place] 02/23/16 0051    Paula Libra, MD 02/23/16 [phone]

## 2016-02-22 NOTE — ED Notes (Signed)
Patient transported to Ultrasound 

## 2016-02-22 NOTE — ED Notes (Signed)
PA at bedside.

## 2016-02-23 ENCOUNTER — Ambulatory Visit
Admission: RE | Admit: 2016-02-23 | Discharge: 2016-02-23 | Disposition: A | Discharge status: Home / Self Care | Payer: 59 | Source: Ambulatory Visit | Attending: Gastroenterology | Admitting: Gastroenterology

## 2016-02-23 ENCOUNTER — Other Ambulatory Visit: Payer: Self-pay | Admitting: Gastroenterology

## 2016-02-23 DIAGNOSIS — R1031 Right lower quadrant pain: Secondary | ICD-10-CM

## 2016-02-23 DIAGNOSIS — R1011 Right upper quadrant pain: Secondary | ICD-10-CM | POA: Diagnosis not present

## 2016-02-23 MED ORDER — ONDANSETRON HCL 4 MG PO TABS: 4.0000 mg | ORAL_TABLET | Freq: Four times a day (QID) | ORAL | 0 refills | Status: DC

## 2016-02-23 MED ORDER — IOPAMIDOL (ISOVUE-300) INJECTION 61%
100.0000 mL | Freq: Once | INTRAVENOUS | Status: AC | PRN
  Administered 2016-02-23: 100 mL via INTRAVENOUS

## 2016-02-23 NOTE — Discharge Instructions (Signed)
Keep your appointment with [facility] gastro[facility]ology tomorrow to be seen regarding your abdominal pain. Take the Zofran as needed for nausea.  Return to emergency department if you experience uncontrolled vomiting, worsening abdominal pain, blood in your vomit, fever, diarrhea, blood in her stool, or any other concerning symptoms.

## 2016-02-26 ENCOUNTER — Emergency Department (HOSPITAL_COMMUNITY)
Admission: EM | Admit: 2016-02-26 | Discharge: 2016-02-27 | Disposition: A | Discharge status: Home / Self Care | Payer: 59 | Attending: Emergency Medicine | Admitting: Emergency Medicine

## 2016-02-26 ENCOUNTER — Emergency Department (HOSPITAL_COMMUNITY): Payer: 59

## 2016-02-26 DIAGNOSIS — R0789 Other chest pain: Secondary | ICD-10-CM | POA: Insufficient documentation

## 2016-02-26 DIAGNOSIS — R0602 Shortness of breath: Secondary | ICD-10-CM | POA: Diagnosis not present

## 2016-02-26 DIAGNOSIS — R05 Cough: Secondary | ICD-10-CM | POA: Diagnosis not present

## 2016-02-26 DIAGNOSIS — R112 Nausea with vomiting, unspecified: Secondary | ICD-10-CM | POA: Insufficient documentation

## 2016-02-26 DIAGNOSIS — R634 Abnormal weight loss: Secondary | ICD-10-CM | POA: Diagnosis not present

## 2016-02-26 DIAGNOSIS — R1013 Epigastric pain: Secondary | ICD-10-CM | POA: Diagnosis not present

## 2016-02-26 DIAGNOSIS — Z79899 Other long term (current) drug therapy: Secondary | ICD-10-CM | POA: Diagnosis not present

## 2016-02-26 DIAGNOSIS — R1033 Periumbilical pain: Secondary | ICD-10-CM | POA: Diagnosis present

## 2016-02-26 DIAGNOSIS — R079 Chest pain, unspecified: Secondary | ICD-10-CM | POA: Diagnosis not present

## 2016-02-26 LAB — BASIC METABOLIC PANEL
ANION GAP: 6 (ref 5–15)
BUN: 11 mg/dL (ref 6–20)
CALCIUM: 9.2 mg/dL (ref 8.9–10.3)
CO2: 27 mmol/L (ref 22–32)
Chloride: 107 mmol/L (ref 101–111)
Creatinine, Ser: 1.02 mg/dL (ref 0.61–1.24)
GLUCOSE: 91 mg/dL (ref 65–99)
Potassium: 3.9 mmol/L (ref 3.5–5.1)
Sodium: 140 mmol/L (ref 135–145)

## 2016-02-26 LAB — I-STAT TROPONIN, ED: TROPONIN I, POC: 0 ng/mL (ref 0.00–0.08)

## 2016-02-26 LAB — CBC
HCT: 44.6 % (ref 39.0–52.0)
HEMOGLOBIN: 15.6 g/dL (ref 13.0–17.0)
MCH: 28.4 pg (ref 26.0–34.0)
MCHC: 35 g/dL (ref 30.0–36.0)
MCV: 81.1 fL (ref 78.0–100.0)
PLATELETS: 253 10*3/uL (ref 150–400)
RBC: 5.5 MIL/uL (ref 4.22–5.81)
RDW: 12.3 % (ref 11.5–15.5)
WBC: 6.7 10*3/uL (ref 4.0–10.5)

## 2016-02-26 NOTE — ED Provider Notes (Signed)
[facility]-EMERGENCY DEPT Provider Note   CSN: [id number] Arrival date & time: 02/26/16  2055  By signing my name below, I, Samuel Townsend, attest that this documentation has been prepared under the direction and in the presence of Samuel Townsend Katalena Malveaux, MD . Electronically Signed: Nelwyn SalisburyJoshua Townsend, Scribe. 02/26/2016. 11:58 PM.   History   Chief Complaint Chief Complaint  Patient presents with  . Chest Pain   The history is provided by the patient and a parent. No language interpreter was used.    HPI Comments:  Samuel Townsend is a 25 y.o. male who presents to the Emergency Department complaining of gradually worsening intermittent periumbilical abdominal pain onset two weeks ago. He describes his pain as worse on the evening, and absent during the morning. Pt reports that he has been to the ER four times and urgent care once in the past two weeks for the same symptoms. He notes that he has had a previous CT, ultrasound, and referral to nephrologist during these previous visits. The pt reports that he specifically came in today due to the appearance of new associated symptom of chest pain. Pt also endorses associated nausea, vomiting, shortness of breath, weight loss, and cough.    Past Medical History:  Diagnosis Date  . Constipation     Patient Active Problem List   Diagnosis Date Noted  . Chronic cough 01/28/2016  . Situational anxiety 11/18/2014  . Underweight 11/18/2014  . High risk sexual behavior 11/18/2014    No past surgical history on file.   Home Medications    Prior to Admission medications   Medication Sig Start Date End Date Taking? Authorizing Provider  acidophilus (RISAQUAD) CAPS capsule Take 1 capsule by mouth daily.   Yes Historical Provider, MD  albuterol (PROVENTIL HFA;VENTOLIN HFA) 108 (90 Base) MCG/ACT inhaler Inhale 2 puffs into the lungs every 4 (four) hours as needed for wheezing or shortness of breath (cough, shortness of breath or wheezing.). 12/17/15  Yes Emi Belfasteborah  B Gessner, FNP  benzonatate (TESSALON) 200 MG capsule Take 1 capsule (200 mg total) by mouth 3 (three) times daily as needed for cough. 01/28/16  Yes Leslye Peerobert S Byrum, MD  ondansetron (ZOFRAN) 4 MG tablet Take 1 tablet (4 mg total) by mouth every 6 (six) hours. 02/23/16  Yes Jessica L Focht, PA  OVER THE COUNTER MEDICATION Take 2 capsules by mouth 2 (two) times daily as needed (for IBS symptoms). IBgard   Yes Historical Provider, MD  omeprazole (PRILOSEC) 20 MG capsule Take 1 capsule (20 mg total) by mouth daily. 02/27/16   Samuel Townsend Larisha Vencill, MD  sucralfate (CARAFATE) 1 GM/10ML suspension Take 10 mLs (1 g total) by mouth 4 (four) times daily -  with meals and at bedtime. Please use three times daily for the next five days. 02/27/16   Samuel Townsend Talor Cheema, MD    Family History Family History  Problem Relation Age of Onset  . Sarcoidosis Mother   . Diabetes Father   . Mental illness Father     Dissociative Identitiy Disorder  . Cancer Maternal Grandmother     lung and metastatic  . Diabetes Maternal Grandmother   . Heart disease Maternal Grandmother   . Hyperlipidemia Maternal Grandmother   . Stroke Paternal Grandmother   . Heart failure Neg Hx   . Hypertension Neg Hx     Social History Social History  Substance Use Topics  . Smoking status: Never Smoker  . Smokeless tobacco: Never Used  . Alcohol use 1.8 oz/week  3 Glasses of wine per week     Comment: weekends     Allergies   Coconut flavor and Mushroom extract complex   Review of Systems Review of Systems  Cardiovascular: Positive for chest pain.     Physical Exam Updated Vital Signs BP 105/72   Pulse (!) 59   Temp 98.1 F (36.7 C) (Oral)   Resp 20   SpO2 100%   Physical Exam  Constitutional: He is oriented to person, place, and time. He appears well-developed. No distress.  HENT:  Head: Normocephalic and atraumatic.  Eyes: Conjunctivae and EOM are normal.  Cardiovascular: Normal rate and regular rhythm.     Pulmonary/Chest: Effort normal. No stridor. No respiratory distress.  Abdominal: He exhibits no distension.  Musculoskeletal: He exhibits no edema.  Neurological: He is alert and oriented to person, place, and time.  Skin: Skin is warm and dry.  Psychiatric: He has a normal mood and affect.  Nursing note and vitals reviewed.    ED Treatments / Results  DIAGNOSTIC STUDIES:  Oxygen Saturation is 100% on RA, normal by my interpretation.    COORDINATION OF CARE:  11:59 PM Discussed treatment plan with pt at bedside which included imaging and pt agreed to plan.  Labs (all labs ordered are listed, but only abnormal results are displayed) Labs Reviewed  BASIC METABOLIC PANEL  CBC  I-STAT TROPOININ, ED    EKG  EKG Interpretation  Date/Time:  Friday February 26 2016 21:11:30 EDT Ventricular Rate:  71 PR Interval:    QRS Duration: 83 QT Interval:  359 QTC Calculation: 391 R Axis:   81 Text Interpretation:  Sinus rhythm ST elev, probable normal early repol pattern Baseline wander in lead(s) V6 No significant change since last tracing Abnormal ekg Confirmed by Samuel Munch  MD [phone]) on 02/27/2016 12:03:49 AM       Radiology Dg Chest 2 View  Result Date: 02/26/2016 CLINICAL DATA:  25 year old male with acute chest pain. EXAM: CHEST  2 VIEW COMPARISON:  01/28/2016 FINDINGS: The cardiomediastinal silhouette is unremarkable. There is no evidence of focal airspace disease, pulmonary edema, suspicious pulmonary nodule/mass, pleural effusion, or pneumothorax. No acute bony abnormalities are identified. IMPRESSION: No active cardiopulmonary disease. Electronically Signed   By: Samuel Townsend M.D.   On: 02/26/2016 21:59    Procedures Procedures (including critical care time)  Medications Ordered in ED Medications  gi cocktail (Maalox,Lidocaine,Donnatal) (30 mLs Oral Given 02/27/16 0019)     Initial Impression / Assessment and Plan / ED Course  I have reviewed the triage vital  signs and the nursing notes.  Pertinent labs & imaging results that were available during my care of the patient were reviewed by me and considered in my medical decision making (see chart for details).  Clinical Course    1:44 AM Patient in no distress, chest pain has resolved following provision of GI cocktail per We discussed all findings, the patient's mother present. Patient will follow up with GI, after initiation of PPI, Carafate.  Young male presents with ongoing episodic chest pain, belly pain, typically in the evening, typically with meals. Description of symptoms consistent with gastroesophagitis, and the patient has had multiple recent evaluations, both with GI and with our emergency department colleagues, including imaging, demonstrating gallbladder sludge, otherwise unremarkable labs. Here, with resolution of his symptoms following provision of GI cocktail, or suspicion for gastroesophagitis, patient discharged with appropriate medication to follow-up with his Urologist.   Final Clinical Impressions(s) / ED Diagnoses  Final diagnoses:  Atypical chest pain  Epigastric pain    New Prescriptions New Prescriptions   OMEPRAZOLE (PRILOSEC) 20 MG CAPSULE    Take 1 capsule (20 mg total) by mouth daily.   SUCRALFATE (CARAFATE) 1 GM/10ML SUSPENSION    Take 10 mLs (1 g total) by mouth 4 (four) times daily -  with meals and at bedtime. Please use three times daily for the next five days.    I personally performed the services described in this documentation, which was scribed in my presence. The recorded information has been reviewed and is accurate.        Samuel Munch, MD 02/27/16 [phone]

## 2016-02-26 NOTE — ED Triage Notes (Signed)
Patient presents for left sided chest pain, SOB, non productive cough, lightheadedness. Denies fever, urinary symptoms. A&O x4.

## 2016-02-27 DIAGNOSIS — R0789 Other chest pain: Secondary | ICD-10-CM | POA: Diagnosis not present

## 2016-02-27 DIAGNOSIS — R1013 Epigastric pain: Secondary | ICD-10-CM | POA: Diagnosis not present

## 2016-02-27 DIAGNOSIS — R0602 Shortness of breath: Secondary | ICD-10-CM | POA: Diagnosis not present

## 2016-02-27 DIAGNOSIS — R112 Nausea with vomiting, unspecified: Secondary | ICD-10-CM | POA: Diagnosis not present

## 2016-02-27 DIAGNOSIS — R05 Cough: Secondary | ICD-10-CM | POA: Diagnosis not present

## 2016-02-27 DIAGNOSIS — Z79899 Other long term (current) drug therapy: Secondary | ICD-10-CM | POA: Diagnosis not present

## 2016-02-27 DIAGNOSIS — R634 Abnormal weight loss: Secondary | ICD-10-CM | POA: Diagnosis not present

## 2016-02-27 MED ORDER — GI COCKTAIL ~~LOC~~
30.0000 mL | Freq: Once | ORAL | Status: AC
  Administered 2016-02-27: 30 mL via ORAL
  Filled 2016-02-27: qty 30

## 2016-02-27 MED ORDER — OMEPRAZOLE 20 MG PO CPDR: 20.0000 mg | DELAYED_RELEASE_CAPSULE | Freq: Every day | ORAL | 0 refills | Status: DC

## 2016-02-27 MED ORDER — SUCRALFATE 1 GM/10ML PO SUSP: 1.0000 g | Freq: Three times a day (TID) | ORAL | 0 refills | Status: DC

## 2016-02-27 NOTE — Discharge Instructions (Signed)
As discussed, your evaluation today has been largely reassuring.  But, it is important that you monitor your condition carefully, and do not hesitate to return to the ED if you develop new, or concerning changes in your condition. ? ?Otherwise, please follow-up with your physician for appropriate ongoing care. ? ?

## 2016-03-04 ENCOUNTER — Encounter (HOSPITAL_COMMUNITY): Payer: Self-pay | Admitting: *Deleted

## 2016-03-04 ENCOUNTER — Inpatient Hospital Stay (HOSPITAL_COMMUNITY)
Admission: EM | Admit: 2016-03-04 | Discharge: 2016-03-09 | DRG: 103 | Disposition: A | Discharge status: Home / Self Care | Payer: 59 | Attending: Family Medicine | Admitting: Family Medicine

## 2016-03-04 DIAGNOSIS — E860 Dehydration: Secondary | ICD-10-CM

## 2016-03-04 DIAGNOSIS — F419 Anxiety disorder, unspecified: Secondary | ICD-10-CM | POA: Diagnosis not present

## 2016-03-04 DIAGNOSIS — G43D0 Abdominal migraine, not intractable: Secondary | ICD-10-CM | POA: Diagnosis present

## 2016-03-04 DIAGNOSIS — K929 Disease of digestive system, unspecified: Secondary | ICD-10-CM

## 2016-03-04 DIAGNOSIS — Z79899 Other long term (current) drug therapy: Secondary | ICD-10-CM

## 2016-03-04 DIAGNOSIS — R1110 Vomiting, unspecified: Secondary | ICD-10-CM

## 2016-03-04 DIAGNOSIS — K920 Hematemesis: Secondary | ICD-10-CM | POA: Diagnosis present

## 2016-03-04 DIAGNOSIS — Z681 Body mass index (BMI) 19 or less, adult: Secondary | ICD-10-CM | POA: Diagnosis not present

## 2016-03-04 DIAGNOSIS — R112 Nausea with vomiting, unspecified: Secondary | ICD-10-CM | POA: Diagnosis not present

## 2016-03-04 DIAGNOSIS — R634 Abnormal weight loss: Secondary | ICD-10-CM | POA: Diagnosis present

## 2016-03-04 DIAGNOSIS — G43A0 Cyclical vomiting, not intractable: Secondary | ICD-10-CM | POA: Diagnosis not present

## 2016-03-04 HISTORY — DX: Disease of digestive system, unspecified: K92.9

## 2016-03-04 HISTORY — DX: Hematemesis: K92.0

## 2016-03-04 LAB — COMPREHENSIVE METABOLIC PANEL
ALBUMIN: 3.9 g/dL (ref 3.5–5.0)
ALT: 15 U/L — ABNORMAL LOW (ref 17–63)
AST: 19 U/L (ref 15–41)
Alkaline Phosphatase: 41 U/L (ref 38–126)
Anion gap: 4 — ABNORMAL LOW (ref 5–15)
BUN: 8 mg/dL (ref 6–20)
CHLORIDE: 108 mmol/L (ref 101–111)
CO2: 27 mmol/L (ref 22–32)
Calcium: 8.8 mg/dL — ABNORMAL LOW (ref 8.9–10.3)
Creatinine, Ser: 1.05 mg/dL (ref 0.61–1.24)
GFR calc Af Amer: >60 mL/min (ref >60)
GFR calc non Af Amer: >60 mL/min (ref >60)
GLUCOSE: 100 mg/dL — AB (ref 65–99)
POTASSIUM: 3.9 mmol/L (ref 3.5–5.1)
Sodium: 139 mmol/L (ref 135–145)
Total Bilirubin: 0.7 mg/dL (ref 0.3–1.2)
Total Protein: 6.8 g/dL (ref 6.5–8.1)

## 2016-03-04 LAB — RAPID URINE DRUG SCREEN, HOSP PERFORMED
AMPHETAMINES: NOT DETECTED
BARBITURATES: NOT DETECTED
Benzodiazepines: NOT DETECTED
Cocaine: NOT DETECTED
Opiates: NOT DETECTED
Tetrahydrocannabinol: NOT DETECTED

## 2016-03-04 LAB — CBC
HEMATOCRIT: 45.1 % (ref 39.0–52.0)
HEMOGLOBIN: 14.8 g/dL (ref 13.0–17.0)
MCH: 27.9 pg (ref 26.0–34.0)
MCHC: 32.8 g/dL (ref 30.0–36.0)
MCV: 85.1 fL (ref 78.0–100.0)
Platelets: 238 10*3/uL (ref 150–400)
RBC: 5.3 MIL/uL (ref 4.22–5.81)
RDW: 12.3 % (ref 11.5–15.5)
WBC: 5.2 10*3/uL (ref 4.0–10.5)

## 2016-03-04 LAB — URINALYSIS, ROUTINE W REFLEX MICROSCOPIC
BILIRUBIN URINE: NEGATIVE
Glucose, UA: NEGATIVE mg/dL
Hgb urine dipstick: NEGATIVE
KETONES UR: NEGATIVE mg/dL
LEUKOCYTES UA: NEGATIVE
NITRITE: NEGATIVE
PH: 8 (ref 5.0–8.0)
Protein, ur: NEGATIVE mg/dL
SPECIFIC GRAVITY, URINE: 1.018 (ref 1.005–1.030)

## 2016-03-04 LAB — LIPASE, BLOOD: LIPASE: 31 U/L (ref 11–51)

## 2016-03-04 MED ORDER — ACETAMINOPHEN 325 MG PO TABS: 650.0000 mg | ORAL_TABLET | Freq: Four times a day (QID) | ORAL | Status: DC | PRN

## 2016-03-04 MED ORDER — ONDANSETRON HCL 4 MG/2ML IJ SOLN
4.0000 mg | Freq: Once | INTRAMUSCULAR | Status: AC
  Administered 2016-03-04: 4 mg via INTRAVENOUS
  Filled 2016-03-04: qty 2

## 2016-03-04 MED ORDER — ENOXAPARIN SODIUM 40 MG/0.4ML ~~LOC~~ SOLN
40.0000 mg | SUBCUTANEOUS | Status: DC
  Administered 2016-03-04: 40 mg via SUBCUTANEOUS
  Filled 2016-03-04 (×3): qty 0.4

## 2016-03-04 MED ORDER — ALBUTEROL SULFATE (2.5 MG/3ML) 0.083% IN NEBU: 3.0000 mL | INHALATION_SOLUTION | RESPIRATORY_TRACT | Status: DC | PRN

## 2016-03-04 MED ORDER — BENZONATATE 100 MG PO CAPS
200.0000 mg | ORAL_CAPSULE | Freq: Three times a day (TID) | ORAL | Status: DC | PRN
Start: 2016-03-04 — End: 2016-03-09

## 2016-03-04 MED ORDER — PANTOPRAZOLE SODIUM 40 MG PO TBEC
40.0000 mg | DELAYED_RELEASE_TABLET | Freq: Every day | ORAL | Status: DC
  Administered 2016-03-07 – 2016-03-09 (×2): 40 mg via ORAL
  Filled 2016-03-04 (×3): qty 1

## 2016-03-04 MED ORDER — ONDANSETRON HCL 4 MG/2ML IJ SOLN
4.0000 mg | Freq: Four times a day (QID) | INTRAMUSCULAR | Status: DC
  Administered 2016-03-04 – 2016-03-09 (×14): 4 mg via INTRAVENOUS
  Filled 2016-03-04 (×15): qty 2

## 2016-03-04 MED ORDER — POLYETHYLENE GLYCOL 3350 17 G PO PACK: 17.0000 g | PACK | Freq: Every day | ORAL | Status: DC | PRN

## 2016-03-04 MED ORDER — SUCRALFATE 1 GM/10ML PO SUSP
1.0000 g | Freq: Three times a day (TID) | ORAL | Status: DC
  Administered 2016-03-05 – 2016-03-07 (×9): 1 g via ORAL
  Filled 2016-03-04 (×10): qty 10

## 2016-03-04 MED ORDER — SODIUM CHLORIDE 0.9 % IV SOLN
INTRAVENOUS | Status: DC
  Administered 2016-03-04 – 2016-03-06 (×6): via INTRAVENOUS

## 2016-03-04 MED ORDER — ACETAMINOPHEN 650 MG RE SUPP: 650.0000 mg | Freq: Four times a day (QID) | RECTAL | Status: DC | PRN
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
## 2016-03-04 NOTE — ED Triage Notes (Signed)
The pt is c/o  Multiple complaints for 3 weeks he has been here x 5 for the same his last time here was last Friday.  He has a headache vomiting abd pain   He has an appointment with a gi doctor tuesday

## 2016-03-04 NOTE — ED Provider Notes (Signed)
[facility]-EMERGENCY DEPT Provider Note   CSN: [id number] Arrival date & time: 03/04/16  1517  By signing my name below, I, Aggie Moats, attest that this documentation has been prepared under the direction and in the presence of Sharilyn Sites, PA-C. Electronically signed by: Aggie Moats, ED Scribe. 03/04/16. 6:05 PM.  History   Chief Complaint Chief Complaint  Patient presents with  . Abdominal Pain   The history is provided by the patient. No language interpreter was used.   HPI Comments:  Samuel Townsend is a 25 y.o. male who presents to the Emergency Department complaining of gradually worsening abdominal pain and nausea/vomiting, which started 3 weeks ago. Pt has been seen in the ED several times for the same symptoms without relief. Pt reports that symptoms normally start around 6:30 PM daily, but today's episode started after eating this morning at work. Associated symptoms include headache, light headedness, multiple episodes of emesis and decreased appetite secondary to pain. He was prescribed Prilosec and Carafate at the last visit and takes probiotics, which have not relieved pain. Pt has only experienced 3 days without emesis since onset. Denies diarrhea and fever. He is scheduled to see GI on Tuesday-- was told he may need EGD. No prior abdominal surgeries.  No fever, chills, sweats.  Was able to eat small amount of rice today, no other oral intake.    Past Medical History:  Diagnosis Date  . Constipation     Patient Active Problem List   Diagnosis Date Noted  . Chronic cough 01/28/2016  . Situational anxiety 11/18/2014  . Underweight 11/18/2014  . High risk sexual behavior 11/18/2014    History reviewed. No pertinent surgical history.     Home Medications    Prior to Admission medications   Medication Sig Start Date End Date Taking? Authorizing Provider  acidophilus (RISAQUAD) CAPS capsule Take 1 capsule by mouth daily.    Historical Provider, MD  albuterol (PROVENTIL  HFA;VENTOLIN HFA) 108 (90 Base) MCG/ACT inhaler Inhale 2 puffs into the lungs every 4 (four) hours as needed for wheezing or shortness of breath (cough, shortness of breath or wheezing.). 12/17/15   Emi Belfast, FNP  benzonatate (TESSALON) 200 MG capsule Take 1 capsule (200 mg total) by mouth 3 (three) times daily as needed for cough. 01/28/16   Leslye Peer, MD  omeprazole (PRILOSEC) 20 MG capsule Take 1 capsule (20 mg total) by mouth daily. 02/27/16   Gerhard Munch, MD  ondansetron (ZOFRAN) 4 MG tablet Take 1 tablet (4 mg total) by mouth every 6 (six) hours. 02/23/16   Jerre Simon, PA  OVER THE COUNTER MEDICATION Take 2 capsules by mouth 2 (two) times daily as needed (for IBS symptoms). IBgard    Historical Provider, MD  sucralfate (CARAFATE) 1 GM/10ML suspension Take 10 mLs (1 g total) by mouth 4 (four) times daily -  with meals and at bedtime. Please use three times daily for the next five days. 02/27/16   Gerhard Munch, MD    Family History Family History  Problem Relation Age of Onset  . Sarcoidosis Mother   . Diabetes Father   . Mental illness Father     Dissociative Identitiy Disorder  . Cancer Maternal Grandmother     lung and metastatic  . Diabetes Maternal Grandmother   . Heart disease Maternal Grandmother   . Hyperlipidemia Maternal Grandmother   . Stroke Paternal Grandmother   . Heart failure Neg Hx   . Hypertension Neg Hx  Social History Social History  Substance Use Topics  . Smoking status: Never Smoker  . Smokeless tobacco: Never Used  . Alcohol use 1.8 oz/week    3 Glasses of wine per week     Comment: weekends     Allergies   Coconut flavor and Mushroom extract complex   Review of Systems Review of Systems  Constitutional: Negative for fever.  Gastrointestinal: Positive for abdominal pain, nausea and vomiting. Negative for diarrhea.  All other systems reviewed and are negative.    Physical Exam Updated Vital Signs BP 113/72 (BP Location:  Left Arm)   Pulse 96   Temp 98.3 F (36.8 C) (Oral)   Resp 18   Ht 6\' 3"  (1.905 m)   Wt 135 lb 9 oz (61.5 kg)   SpO2 96%   BMI 16.94 kg/m   Physical Exam  Constitutional: He is oriented to person, place, and time. He appears well-developed and well-nourished.  HENT:  Head: Normocephalic and atraumatic.  Mouth/Throat: Oropharynx is clear and moist.  Mildly dry mucous membranes  Eyes: Conjunctivae and EOM are normal. Pupils are equal, round, and reactive to light.  Neck: Normal range of motion.  Cardiovascular: Normal rate, regular rhythm and normal heart sounds.   Pulmonary/Chest: Effort normal and breath sounds normal.  Abdominal: Soft. Bowel sounds are normal. There is no tenderness. There is no rigidity and no guarding.  Musculoskeletal: Normal range of motion.  Neurological: He is alert and oriented to person, place, and time.  Skin: Skin is warm and dry.  Psychiatric: He has a normal mood and affect.  Nursing note and vitals reviewed.    ED Treatments / Results  DIAGNOSTIC STUDIES:  Oxygen Saturation is 96% on room air, normal by my interpretation.    COORDINATION OF CARE:  6:00 PM Discussed treatment plan with pt at bedside, which includes IV fluids and nausea medication, and pt agreed to plan.  Labs (all labs ordered are listed, but only abnormal results are displayed) Labs Reviewed  COMPREHENSIVE METABOLIC PANEL - Abnormal; Notable for the following:       Result Value   Glucose, Bld 100 (*)    Calcium 8.8 (*)    ALT 15 (*)    Anion gap 4 (*)    All other components within normal limits  LIPASE, BLOOD  CBC  URINALYSIS, ROUTINE W REFLEX MICROSCOPIC (NOT AT [facility])    EKG  EKG Interpretation None       Radiology No results found.  Procedures Procedures (including critical care time)  Medications Ordered in ED Medications - No data to display   Initial Impression / Assessment and Plan / ED Course  I have reviewed the triage vital signs and the  nursing notes.  Pertinent labs & imaging results that were available during my care of the patient were reviewed by me and considered in my medical decision making (see chart for details).  Clinical Course   Spoke with Dr. Deirdre Priest (patient's PCP) upon patient's arrival to ED.  States he was supposed to be directly admitted due to persistent symptoms over the past 3 weeks and multiple ED visits.  Wants him admitted to med-surg floor barring any electrolyte abnormalities.  Labs here today are reassuring.  Patient is afebrile, non-toxic.  Given IVF and zofran here.  Admitted to med-surg bed.  Final Clinical Impressions(s) / ED Diagnoses   Final diagnoses:  Nausea and vomiting, vomiting of unspecified type    New Prescriptions New Prescriptions   No  medications on file   I personally performed the services described in this documentation, which was scribed in my presence. The recorded information has been reviewed and is accurate.    Garlon HatchetLisa M Jaiden Dinkins, PA-C 03/04/16 1919    Courteney Randall AnLyn Mackuen, MD 03/04/16 2237

## 2016-03-04 NOTE — ED Notes (Signed)
Attempted report 

## 2016-03-05 DIAGNOSIS — F419 Anxiety disorder, unspecified: Secondary | ICD-10-CM | POA: Diagnosis not present

## 2016-03-05 DIAGNOSIS — R109 Unspecified abdominal pain: Secondary | ICD-10-CM | POA: Diagnosis not present

## 2016-03-05 DIAGNOSIS — J45909 Unspecified asthma, uncomplicated: Secondary | ICD-10-CM | POA: Diagnosis not present

## 2016-03-05 DIAGNOSIS — Z681 Body mass index (BMI) 19 or less, adult: Secondary | ICD-10-CM | POA: Diagnosis not present

## 2016-03-05 DIAGNOSIS — R112 Nausea with vomiting, unspecified: Secondary | ICD-10-CM | POA: Diagnosis not present

## 2016-03-05 DIAGNOSIS — E860 Dehydration: Secondary | ICD-10-CM | POA: Diagnosis not present

## 2016-03-05 DIAGNOSIS — Z79899 Other long term (current) drug therapy: Secondary | ICD-10-CM | POA: Diagnosis not present

## 2016-03-05 DIAGNOSIS — G43A0 Cyclical vomiting, not intractable: Secondary | ICD-10-CM | POA: Diagnosis present

## 2016-03-05 DIAGNOSIS — G43D0 Abdominal migraine, not intractable: Secondary | ICD-10-CM | POA: Diagnosis present

## 2016-03-05 DIAGNOSIS — R634 Abnormal weight loss: Secondary | ICD-10-CM | POA: Diagnosis present

## 2016-03-05 DIAGNOSIS — R1110 Vomiting, unspecified: Secondary | ICD-10-CM | POA: Diagnosis not present

## 2016-03-05 DIAGNOSIS — R1010 Upper abdominal pain, unspecified: Secondary | ICD-10-CM | POA: Diagnosis not present

## 2016-03-05 LAB — BASIC METABOLIC PANEL
ANION GAP: 3 — AB (ref 5–15)
BUN: 8 mg/dL (ref 6–20)
CHLORIDE: 111 mmol/L (ref 101–111)
CO2: 29 mmol/L (ref 22–32)
Calcium: 8.4 mg/dL — ABNORMAL LOW (ref 8.9–10.3)
Creatinine, Ser: 1 mg/dL (ref 0.61–1.24)
GFR calc Af Amer: >60 mL/min (ref >60)
GLUCOSE: 97 mg/dL (ref 65–99)
POTASSIUM: 3.7 mmol/L (ref 3.5–5.1)
Sodium: 143 mmol/L (ref 135–145)

## 2016-03-05 LAB — CBC
HEMATOCRIT: 42.4 % (ref 39.0–52.0)
HEMOGLOBIN: 13.8 g/dL (ref 13.0–17.0)
MCH: 27.6 pg (ref 26.0–34.0)
MCHC: 32.5 g/dL (ref 30.0–36.0)
MCV: 84.8 fL (ref 78.0–100.0)
Platelets: 229 10*3/uL (ref 150–400)
RBC: 5 MIL/uL (ref 4.22–5.81)
RDW: 12.3 % (ref 11.5–15.5)
WBC: 5.7 10*3/uL (ref 4.0–10.5)

## 2016-03-05 LAB — SEDIMENTATION RATE: Sed Rate: 1 mm/hr (ref 0–16)

## 2016-03-05 LAB — HIV ANTIBODY (ROUTINE TESTING W REFLEX): HIV Screen 4th Generation wRfx: NONREACTIVE

## 2016-03-05 LAB — TSH: TSH: 1.182 u[IU]/mL (ref 0.350–4.500)

## 2016-03-05 MED ORDER — ADULT MULTIVITAMIN W/MINERALS CH
1.0000 | ORAL_TABLET | Freq: Every day | ORAL | Status: DC
  Administered 2016-03-05 – 2016-03-09 (×3): 1 via ORAL
  Filled 2016-03-05 (×3): qty 1

## 2016-03-05 NOTE — Progress Notes (Signed)
Family Medicine Teaching Service Daily Progress Note Intern Pager: [phone]  Patient name: Samuel Townsend Medical record number: [id number] Date of birth: 12-Apr-1991 Age: 25 y.o. Gender: male  Primary Care Provider: No PCP Per Patient Consultants: GI Code Status: Full   Pt Overview and Major Events to Date:  9/8: Admit to FPTS, IVF and Zofran  Assessment and Plan: Samuel Townsend is a 25 y.o. male presenting with nausea and non-bilious, non-bloody vomiting with intermittent umbilical abdominal pain of 3 week duration. PMH is significant for constipation and anxiety.  Abdominal Pain, Nausea, and Vomiting:  N/V and abdominal pain resolved this morning, has an appetite. Unclear etiology at this point. Differentials still include; anxiety/depression, Irritable Bowel Syndrome (although atypical presentation), Inflammatory Bowel Disease (again, would be an atypical presentation), gastroparesis, symptomatic cholelithiasis (although labs normal), manifestation of eating disorder (although unlikely as patient denies any eating disorder). TSH and ESR normal. Follows with Dr. Rosalie Townsend at [place]. - GI consulted, appreciate their assistance - AM CMP and CBC - IV Zofran q6h  - HIV ab pending - Continue home meds from outpatient GI (Protonix 40 mg PO daily, Carafate TID with meals) - Tylenol q6h PO PRN pain - Consider HIDA scan based upon rec from 8/30, but will defer to GI - Decrease IVF to 1/2 maintenance - Will give diet  Narrowing of Left Renal Vein: Incidental finding on 8/29 CT of abdomen - May need outpatient follow up for further imaging   FEN/GI: MIVF NS '@125'  mL/hr, NPO Prophylaxis: lovenox  Disposition: Continue to monitor for GI symptoms  Subjective:  No acute events overnight, patient has no complaints this morning. He is hungry and wants to eat breakfast.   Objective: Temp:  [98.1 F (36.7 C)-98.4 F (36.9 C)] 98.4 F (36.9 C) (09/09 0538) Pulse Rate:  [65-96] 72 (09/09  0538) Resp:  [14-18] 16 (09/09 0538) BP: (93-124)/(57-78) 93/57 (09/09 0538) SpO2:  [96 %-100 %] 100 % (09/09 0538) Weight:  [135 lb 9 oz (61.5 kg)-135 lb 9.3 oz (61.5 kg)] 135 lb 9.3 oz (61.5 kg) (09/08 2044) Physical Exam: General: In NAD, thin but muscular, well appearing male laying in bed Cardiovascular: Regular rate and rhythm, no murmurs Respiratory: normal work of breathing, clear to auscultation bilaterally Abdomen: soft, non distended, normal bowel sounds, non tender throughout Extremities: no edema, normal strength in bilateral upper and lower extremities  Laboratory:  Recent Labs Lab 03/04/16 1529 03/05/16 0228  WBC 5.2 5.7  HGB 14.8 13.8  HCT 45.1 42.4  PLT 238 229    Recent Labs Lab 03/04/16 1529 03/05/16 0228  NA 139 143  K 3.9 3.7  CL 108 111  CO2 27 29  BUN 8 8  CREATININE 1.05 1.00  CALCIUM 8.8* 8.4*  PROT 6.8  --   BILITOT 0.7  --   ALKPHOS 41  --   ALT 15*  --   AST 19  --   GLUCOSE 100* 97   TSH 1.182 ESR: 1  Imaging/Diagnostic Tests: No results found.   Samuel Dolly, MD 03/05/2016, 7:21 AM PGY-2, [place] Intern pager: [phone], text pages welcome

## 2016-03-05 NOTE — Consult Note (Signed)
Subjective:   HPI  The patient is a 25 year old male who states that he is been having nausea and vomiting and upper abdominal pain for the past 3 weeks. The nausea and vomiting has been occurring on an intermittent basis. He saw one of our partners at [facility] who was evaluating him. He had a recent CT scan which did not show any abnormality in the GI tract to explain his symptoms. He has been to the emergency room 5 times in the last few weeks according to him and his mother. He got dehydrated and so he was admitted. They wish to have further evaluation. They tell me that the next step that was planned was to do an EGD.  Review of Systems No chest pain or shortness of breath  Past Medical History:  Diagnosis Date  . Constipation    Past Surgical History:  Procedure Laterality Date  . NO PAST SURGERIES     Social History   Social History  . Marital status: Single    Spouse name: N/A  . Number of children: N/A  . Years of education: N/A   Occupational History  . Not on file.   Social History Main Topics  . Smoking status: Never Smoker  . Smokeless tobacco: Never Used  . Alcohol use 3.6 oz/week    6 Glasses of wine per week  . Drug use: No  . Sexual activity: Yes    Birth control/ protection: Condom   Other Topics Concern  . Not on file   Social History Narrative  . No narrative on file   family history includes Cancer in his maternal grandmother; Diabetes in his father and maternal grandmother; Heart disease in his maternal grandmother; Hyperlipidemia in his maternal grandmother; Mental illness in his father; Sarcoidosis in his mother; Stroke in his paternal grandmother.  Current Facility-Administered Medications:  .  0.9 %  sodium chloride infusion, , Intravenous, Continuous, Beaulah Dinning, MD, Last Rate: 75 mL/hr at 03/05/16 1150 .  acetaminophen (TYLENOL) tablet 650 mg, 650 mg, Oral, Q6H PRN **OR** acetaminophen (TYLENOL) suppository 650 mg, 650  mg, Rectal, Q6H PRN, Beaulah Dinning, MD .  albuterol (PROVENTIL) (2.5 MG/3ML) 0.083% nebulizer solution 3 mL, 3 mL, Inhalation, Q4H PRN, Beaulah Dinning, MD .  benzonatate (TESSALON) capsule 200 mg, 200 mg, Oral, TID PRN, Beaulah Dinning, MD .  enoxaparin (LOVENOX) injection 40 mg, 40 mg, Subcutaneous, Q24H, Beaulah Dinning, MD, 40 mg at 03/04/16 2333 .  ondansetron (ZOFRAN) injection 4 mg, 4 mg, Intravenous, Q6H, Beaulah Dinning, MD, 4 mg at 03/05/16 1155 .  pantoprazole (PROTONIX) EC tablet 40 mg, 40 mg, Oral, Daily, Beaulah Dinning, MD .  polyethylene glycol (MIRALAX / GLYCOLAX) packet 17 g, 17 g, Oral, Daily PRN, Beaulah Dinning, MD .  sucralfate (CARAFATE) 1 GM/10ML suspension 1 g, 1 g, Oral, TID WC & HS, Beaulah Dinning, MD, 1 g at 03/05/16 1202 Allergies  Allergen Reactions  . Coconut Flavor Itching  . Mushroom Extract Complex Itching and Swelling     Objective:     BP (!) 93/57 (BP Location: Left Arm)   Pulse 72   Temp 98.4 F (36.9 C) (Oral)   Resp 16   Ht 6\' 3"  (1.905 m)   Wt 61.5 kg (135 lb 9.3 oz)   SpO2 100%   BMI 16.95 kg/m   He is in no acute distress  Nonicteric  Heart regular rhythm no murmurs  Lungs clear  Abdomen:  Bowel sounds normal, soft, nontender  Laboratory No components found for: D1    Assessment:     Recurrent nausea and vomiting and upper abdominal pain of uncertain etiology. This could be related to peptic ulcer disease, gastritis, gastroparesis, or biliary problems.      Plan:     The patient and his mother would like him to remain in the hospital to have further investigation because they are afraid that if he goes home he will just come back to the emergency room again dehydrated and need to be admitted again. I will proceed with EGD tomorrow. If this is negative the next step would be to evaluate the gallbladder.

## 2016-03-05 NOTE — Progress Notes (Signed)
Patient attempted to eat dinner after having scheduled zofran and carafate. Took two bites , began coughing, emesis, shaking, and said he felt light headed. Vs obtained, 120/56, 81, 20 100%room air.

## 2016-03-05 NOTE — Progress Notes (Signed)
Initial Nutrition Assessment  DOCUMENTATION CODES:   Underweight  INTERVENTION:  - Will order daily multivitamin with minerals.  - RD will continue to monitor for additional nutrition-related needs at follow-up.  NUTRITION DIAGNOSIS:   Unintentional weight loss related to acute illness, nausea, vomiting as evidenced by percent weight loss.  GOAL:   Patient will meet greater than or equal to 90% of their needs  MONITOR:   PO intake, Weight trends, Labs, I & O's  REASON FOR ASSESSMENT:   Malnutrition Screening Tool  ASSESSMENT:    25 y.o. male presenting with nausea and non-bilious, non-bloody vomiting with intermittent umbilical abdominal pain of 3 week duration. PMH is significant for constipation and anxiety.  Pt seen for MST. BMI indicates underweight status. Per chart review, pt ate 100% of a sub from [employer] [place] dinner last night. Pt confirms this and states that he ate a biscuit and hashbrowns for breakfast. Lunch tray untouched on bedside table. Pt reports that he did not sleep well last night and was going to take a nap and then eat after awaking. He states that PTA he was having ongoing N/V x3 weeks that would begin ~1830-1930 each evening. He was able to eat a regular breakfast and lunch without issue but would have symptoms after eating dinner; states that symptoms occurred regardless of what was eaten during meal. Pt states that nausea begins immediately to 45 minutes after evening meal and that emesis follows soon after. Pt always feels nauseated prior to emesis. Pt denies any stressful experiences or thoughts around that time each day. Notes indicate pt had a similar instance last year. Plan for EGD tomorrow.   Unable to complete physical assessment at this time per pt request as he is completely covered and preparing to nap. He states that he has always been "small" and that recently he had gained weight to reach maximum weight of 140 lbs. On 02/24/16 he went to his  pulmonologist and weighed 139 lbs at that time. Based on reported weights, pt has lost 4 lbs (3% body weight) in the past 11 days which is significant for time frame. Unable to confirm malnutrition at this time as pt was likely meeting estimated nutrition needs; will need to do physical assessment at follow-up to determine if malnutrition is present.   Medications reviewed; 4 mg IV Zofran QID, 40 mg oral Protonix/day, 1 g Carafate TID.  Labs reviewed; Ca: 8.4 mg/dL. IVF: NS @ 75 mL/hr.    Diet Order:  Diet regular Room service appropriate? Yes; Fluid consistency: Thin  Skin:  Reviewed, no issues  Last BM:  9/8  Height:   Ht Readings from Last 1 Encounters:  03/04/16 6\' 3"  (1.905 m)    Weight:   Wt Readings from Last 1 Encounters:  03/04/16 135 lb 9.3 oz (61.5 kg)    Ideal Body Weight:  89.09 kg  BMI:  Body mass index is 16.95 kg/m.  Estimated Nutritional Needs:   Kcal:  2150-2340 (35-38 grams/kg)  Protein:  80-90 grams  Fluid:  >/= 2.2 L/day  EDUCATION NEEDS:   No education needs identified at this time    Trenton GammonJessica Taja Pentland, MS, RD, LDN Inpatient Clinical Dietitian Pager # [phone] After hours/weekend pager # [phone]

## 2016-03-06 ENCOUNTER — Encounter (HOSPITAL_COMMUNITY): Payer: Self-pay | Admitting: *Deleted

## 2016-03-06 ENCOUNTER — Encounter (HOSPITAL_COMMUNITY): Payer: Self-pay | Admitting: Anesthesiology

## 2016-03-06 ENCOUNTER — Encounter (HOSPITAL_COMMUNITY)
Admission: EM | Disposition: A | Discharge status: Home / Self Care | Payer: Self-pay | Source: Home / Self Care | Attending: Family Medicine

## 2016-03-06 ENCOUNTER — Inpatient Hospital Stay (HOSPITAL_COMMUNITY): Payer: 59

## 2016-03-06 ENCOUNTER — Inpatient Hospital Stay (HOSPITAL_COMMUNITY): Payer: 59 | Admitting: Anesthesiology

## 2016-03-06 DIAGNOSIS — E860 Dehydration: Secondary | ICD-10-CM

## 2016-03-06 HISTORY — PX: ESOPHAGOGASTRODUODENOSCOPY: SHX5428

## 2016-03-06 LAB — CBC
HCT: 42.3 % (ref 39.0–52.0)
Hemoglobin: 14.1 g/dL (ref 13.0–17.0)
MCH: 28.1 pg (ref 26.0–34.0)
MCHC: 33.3 g/dL (ref 30.0–36.0)
MCV: 84.3 fL (ref 78.0–100.0)
PLATELETS: 209 10*3/uL (ref 150–400)
RBC: 5.02 MIL/uL (ref 4.22–5.81)
RDW: 12.5 % (ref 11.5–15.5)
WBC: 4.5 10*3/uL (ref 4.0–10.5)

## 2016-03-06 LAB — BASIC METABOLIC PANEL
Anion gap: 7 (ref 5–15)
BUN: 6 mg/dL (ref 6–20)
CALCIUM: 8.7 mg/dL — AB (ref 8.9–10.3)
CHLORIDE: 108 mmol/L (ref 101–111)
CO2: 26 mmol/L (ref 22–32)
CREATININE: 1.03 mg/dL (ref 0.61–1.24)
GFR calc Af Amer: >60 mL/min (ref >60)
GFR calc non Af Amer: >60 mL/min (ref >60)
Glucose, Bld: 77 mg/dL (ref 65–99)
Potassium: 4.3 mmol/L (ref 3.5–5.1)
SODIUM: 141 mmol/L (ref 135–145)

## 2016-03-06 SURGERY — EGD (ESOPHAGOGASTRODUODENOSCOPY)
Anesthesia: Monitor Anesthesia Care

## 2016-03-06 MED ORDER — BUTAMBEN-TETRACAINE-BENZOCAINE 2-2-14 % EX AERO
INHALATION_SPRAY | CUTANEOUS | Status: DC | PRN
  Administered 2016-03-06: 2 via TOPICAL

## 2016-03-06 MED ORDER — TECHNETIUM TC 99M MEBROFENIN IV KIT
5.0300 | PACK | Freq: Once | INTRAVENOUS | Status: AC | PRN
  Administered 2016-03-06: 5.03 via INTRAVENOUS

## 2016-03-06 MED ORDER — PROPOFOL 10 MG/ML IV BOLUS
INTRAVENOUS | Status: AC
  Filled 2016-03-06: qty 20

## 2016-03-06 MED ORDER — SUCCINYLCHOLINE CHLORIDE 200 MG/10ML IV SOSY
PREFILLED_SYRINGE | INTRAVENOUS | Status: AC
  Filled 2016-03-06: qty 10

## 2016-03-06 MED ORDER — ONDANSETRON HCL 4 MG/2ML IJ SOLN
INTRAMUSCULAR | Status: AC
  Filled 2016-03-06: qty 2

## 2016-03-06 MED ORDER — SODIUM CHLORIDE 0.9 % IV SOLN
INTRAVENOUS | Status: DC | PRN
  Administered 2016-03-06: 09:00:00 via INTRAVENOUS

## 2016-03-06 MED ORDER — DEXAMETHASONE SODIUM PHOSPHATE 10 MG/ML IJ SOLN
INTRAMUSCULAR | Status: AC
  Filled 2016-03-06: qty 1

## 2016-03-06 MED ORDER — MIDAZOLAM HCL 2 MG/2ML IJ SOLN
INTRAMUSCULAR | Status: DC | PRN
  Administered 2016-03-06: 2 mg via INTRAVENOUS

## 2016-03-06 MED ORDER — SODIUM CHLORIDE 0.9 % IV SOLN: INTRAVENOUS | Status: DC

## 2016-03-06 MED ORDER — MIDAZOLAM HCL 2 MG/2ML IJ SOLN
INTRAMUSCULAR | Status: AC
  Filled 2016-03-06: qty 2

## 2016-03-06 MED ORDER — LIDOCAINE 2% (20 MG/ML) 5 ML SYRINGE
INTRAMUSCULAR | Status: AC
  Filled 2016-03-06: qty 5

## 2016-03-06 MED ORDER — PROPOFOL 10 MG/ML IV BOLUS
INTRAVENOUS | Status: DC | PRN
  Administered 2016-03-06 (×2): 10 mg via INTRAVENOUS
  Administered 2016-03-06: 30 mg via INTRAVENOUS
  Administered 2016-03-06 (×2): 20 mg via INTRAVENOUS
  Administered 2016-03-06: 10 mg via INTRAVENOUS

## 2016-03-06 MED ORDER — ONDANSETRON HCL 4 MG/2ML IJ SOLN
INTRAMUSCULAR | Status: DC | PRN
Start: 2016-03-06 — End: 2016-03-06
  Administered 2016-03-06: 4 mg via INTRAVENOUS

## 2016-03-06 MED ORDER — LIDOCAINE 2% (20 MG/ML) 5 ML SYRINGE
INTRAMUSCULAR | Status: DC | PRN
  Administered 2016-03-06: 50 mg via INTRAVENOUS

## 2016-03-06 NOTE — Anesthesia Preprocedure Evaluation (Addendum)
Anesthesia Evaluation  Patient identified by MRN, date of birth, ID band Patient awake    Reviewed: Allergy & Precautions, NPO status , Patient's Chart, lab work & pertinent test results  Airway Mallampati: I  TM Distance: >3 FB Neck ROM: Full    Dental  (+) Teeth Intact, Dental Advisory Given   Pulmonary asthma ,    breath sounds clear to auscultation       Cardiovascular negative cardio ROS   Rhythm:Regular Rate:Normal     Neuro/Psych PSYCHIATRIC DISORDERS Anxiety negative neurological ROS     GI/Hepatic Neg liver ROS, GERD  Medicated,  Endo/Other  negative endocrine ROS  Renal/GU negative Renal ROS  negative genitourinary   Musculoskeletal negative musculoskeletal ROS (+)   Abdominal   Peds negative pediatric ROS (+)  Hematology negative hematology ROS (+)   Anesthesia Other Findings   Reproductive/Obstetrics negative OB ROS                            Lab Results  Component Value Date   WBC 4.5 03/06/2016   HGB 14.1 03/06/2016   HCT 42.3 03/06/2016   MCV 84.3 03/06/2016   PLT 209 03/06/2016   Lab Results  Component Value Date   CREATININE 1.03 03/06/2016   BUN 6 03/06/2016   NA 141 03/06/2016   K 4.3 03/06/2016   CL 108 03/06/2016   CO2 26 03/06/2016   02/2016 EKG: normal sinus rhythm.  Anesthesia Physical Anesthesia Plan  ASA: II  Anesthesia Plan: MAC   Post-op Pain Management:    Induction: Intravenous  Airway Management Planned: Natural Airway  Additional Equipment:   Intra-op Plan:   Post-operative Plan:   Informed Consent: I have reviewed the patients History and Physical, chart, labs and discussed the procedure including the risks, benefits and alternatives for the proposed anesthesia with the patient or authorized representative who has indicated his/her understanding and acceptance.     Plan Discussed with: CRNA  Anesthesia Plan Comments:          Anesthesia Quick Evaluation

## 2016-03-06 NOTE — Transfer of Care (Signed)
Immediate Anesthesia Transfer of Care Note  Patient: Samuel Townsend  Procedure(s) Performed: Procedure(s): ESOPHAGOGASTRODUODENOSCOPY (EGD) (N/A)  Patient Location: Endoscopy Unit  Anesthesia Type:MAC  Level of Consciousness: awake  Airway & Oxygen Therapy: Patient Spontanous Breathing and Patient connected to nasal cannula oxygen  Post-op Assessment: Report given to RN and Post -op Vital signs reviewed and stable  Post vital signs: Reviewed and stable  Last Vitals:  Vitals:   03/06/16 0859 03/06/16 0903  BP:  120/65  Pulse: (!) 56   Resp: 10   Temp: 36.7 C     Last Pain:  Vitals:   03/06/16 0859  TempSrc: Oral  PainSc:          Complications: No apparent anesthesia complications

## 2016-03-06 NOTE — Anesthesia Postprocedure Evaluation (Signed)
Anesthesia Post Note  Patient: Samuel Townsend  Procedure(s) Performed: Procedure(s) (LRB): ESOPHAGOGASTRODUODENOSCOPY (EGD) (N/A)  Patient location during evaluation: Endoscopy Anesthesia Type: MAC Level of consciousness: awake Pain management: pain level controlled Vital Signs Assessment: post-procedure vital signs reviewed and stable Respiratory status: spontaneous breathing Cardiovascular status: blood pressure returned to baseline and stable Anesthetic complications: no    Last Vitals:  Vitals:   03/06/16 0903 03/06/16 0937  BP: 120/65 119/63  Pulse:  (!) 59  Resp:  (!) 21  Temp:      Last Pain:  Vitals:   03/06/16 0859  TempSrc: Oral  PainSc:                  Alanda AmassFRIEDMAN,Arlynn Mcdermid

## 2016-03-06 NOTE — Progress Notes (Signed)
Family Medicine Teaching Service Daily Progress Note Intern Pager: [phone]  Patient name: Samuel Townsend Medical record number: [id number] Date of birth: 12/03/1990 Age: 25 y.o. Gender: male  Primary Care Provider: No PCP Per Patient Consultants: GI Code Status: Full   Pt Overview and Major Events to Date:  9/8: Admit to FPTS, IVF and Zofran 9/10: Plan for EGD  Assessment and Plan: Samuel Townsend is a 25 y.o. male presenting with nausea and non-bilious, non-bloody vomiting with intermittent umbilical abdominal pain of 3 week duration. PMH is significant for constipation and anxiety.  Abdominal Pain, Nausea, and Vomiting:  N/V and abdominal pain with episode of emesis with eating last night, has an appetite. Unclear etiology at this point. Differentials still include; anxiety/depression, Irritable Bowel Syndrome (although atypical presentation), Inflammatory Bowel Disease (again, would be an atypical presentation), gastroparesis, symptomatic cholelithiasis (although labs normal), manifestation of eating disorder (although unlikely as patient denies any eating disorder and weight loss only recent). TSH and ESR normal. Follows with Dr. Brahmbatt at [facility]. HIV ab non-reactive.  - GI consulted, appreciate their assistance; planning for EGD today - AM CMP and CBC - IV Zofran q6h  - Continue home meds from outpatient GI (Protonix 40 mg PO daily, Carafate TID with meals) - Tylenol q6h PO PRN pain - Consider HIDA scan based upon rec from 8/30, but will defer to GI - Decrease IVF to 1/2 maintenance - NPO for EGD  Narrowing of Left Renal Vein: Incidental finding on 8/29 CT of abdomen - May need outpatient follow up for further imaging  FEN/GI: MIVF NS @ 75 mL/hr, NPO Prophylaxis: lovenox  Disposition: Continue to monitor for GI symptoms; pending further work-up recommendations from GI  Subjective:  No acute events overnight. He had some abdominal pain this morning, which improved after  a BM. He had an episode of emesis that followed his typical pattern of feeling nausea and heat followed by throwing up and lack of appetite. He usually takes carafate 15 minutes before meals and took it only 5 minutes before dinner last night because he was hungry.   Objective: Temp:  [98 F (36.7 C)-98.8 F (37.1 C)] 98 F (36.7 C) (09/10 0859) Pulse Rate:  [56-81] 56 (09/10 0859) Resp:  [10-20] 10 (09/10 0859) BP: (105-132)/(54-69) 120/65 (09/10 0903) SpO2:  [100 %] 100 % (09/10 0859) Weight:  [135 lb (61.2 kg)] 135 lb (61.2 kg) (09/10 0859) Physical Exam: General: In NAD, thin but muscular, well appearing male laying in bed Cardiovascular: Regular rate and rhythm, no murmurs Respiratory: normal work of breathing, clear to auscultation bilaterally Abdomen: soft, non distended, normal bowel sounds, mild discomfort but not pain with palpation diffusely but worst over LUQ Extremities: no edema, normal strength in bilateral upper and lower extremities  Laboratory:  Recent Labs Lab 03/04/16 1529 03/05/16 0228 03/06/16 0615  WBC 5.2 5.7 4.5  HGB 14.8 13.8 14.1  HCT 45.1 42.4 42.3  PLT 238 229 209    Recent Labs Lab 03/04/16 1529 03/05/16 0228 03/06/16 0615  NA 139 143 141  K 3.9 3.7 4.3  CL 108 111 108  CO2 27 29 26  BUN 8 8 6  CREATININE 1.05 1.00 1.03  CALCIUM 8.8* 8.4* 8.7*  PROT 6.8  --   --   BILITOT 0.7  --   --   ALKPHOS 41  --   --   ALT 15*  --   --   AST 19  --   --     GLUCOSE 100* 97 77   TSH 1.182 ESR: 1  Imaging/Diagnostic Tests: No results found.   Hillary Moen Fitzgerald, MD 03/06/2016, 9:06 AM PGY-2, [place] [facility] FPTS Intern pager: [phone], text pages welcome  

## 2016-03-06 NOTE — Op Note (Addendum)
[facility] Patient Name: Samuel Townsend Procedure Date : 03/06/2016 MRN: [id number] Attending MD: Graylin Shiver , MD Date of Birth: Apr 23, 1991 CSN: [id number] Age: 25 Admit Type: Inpatient Procedure:                Upper GI endoscopy Indications:              Vomiting Providers:                Graylin Shiver, MD, Priscella Mann, RN, Kandice Robinsons, Technician Referring MD:              Medicines:                Propofol per Anesthesia Complications:            No immediate complications. Estimated Blood Loss:     Estimated blood loss: none. Procedure:                Pre-Anesthesia Assessment:                           - Prior to the procedure, a History and Physical                            was performed, and patient medications and                            allergies were reviewed. The patient's tolerance of                            previous anesthesia was also reviewed. The risks                            and benefits of the procedure and the sedation                            options and risks were discussed with the patient.                            All questions were answered, and informed consent                            was obtained. Prior Anticoagulants: The patient has                            taken no previous anticoagulant or antiplatelet                            agents. ASA Grade Assessment: I - A normal, healthy                            patient. After reviewing the risks and benefits,  the patient was deemed in satisfactory condition to                            undergo the procedure.                           After obtaining informed consent, the endoscope was                            passed under direct vision. Throughout the                            procedure, the patient's blood pressure, pulse, and                            oxygen saturations were monitored continuously. The                             Endoscope was introduced through the mouth, and                            advanced to the second part of duodenum. The upper                            GI endoscopy was accomplished without difficulty.                            The patient tolerated the procedure well. Images                            did not capture. Scope In: Scope Out: Findings:      The examined esophagus was normal.      The entire examined stomach was normal.      The examined duodenum was normal. Impression:               - Normal esophagus.                           - Normal stomach.                           - Normal examined duodenum.                           - No specimens collected. Moderate Sedation:      . Recommendation:           - Advance diet as tolerated.                           - Continue present medications.                           - HIDA scan with ejection fraction to check                            gallbladder function. Procedure  Code(s):        --- Professional ---                           [phone], Esophagogastroduodenoscopy, flexible,                            transoral; diagnostic, including collection of                            specimen(s) by brushing or washing, when performed                            (separate procedure) Diagnosis Code(s):        --- Professional ---                           R11.10, Vomiting, unspecified CPT copyright 2016 American Medical Association. All rights reserved. The codes documented in this report are preliminary and upon coder review may  be revised to meet current compliance requirements. Graylin Shiver, MD 03/06/2016 9:52:52 AM This report has been signed electronically. Number of Addenda: 0

## 2016-03-07 ENCOUNTER — Encounter (HOSPITAL_COMMUNITY): Payer: Self-pay | Admitting: Gastroenterology

## 2016-03-07 LAB — CBC
HEMATOCRIT: 43.7 % (ref 39.0–52.0)
Hemoglobin: 14.1 g/dL (ref 13.0–17.0)
MCH: 27.5 pg (ref 26.0–34.0)
MCHC: 32.3 g/dL (ref 30.0–36.0)
MCV: 85.4 fL (ref 78.0–100.0)
PLATELETS: 216 10*3/uL (ref 150–400)
RBC: 5.12 MIL/uL (ref 4.22–5.81)
RDW: 12.4 % (ref 11.5–15.5)
WBC: 5.6 10*3/uL (ref 4.0–10.5)

## 2016-03-07 LAB — BASIC METABOLIC PANEL
ANION GAP: 6 (ref 5–15)
BUN: 11 mg/dL (ref 6–20)
CALCIUM: 8.6 mg/dL — AB (ref 8.9–10.3)
CO2: 30 mmol/L (ref 22–32)
Chloride: 105 mmol/L (ref 101–111)
Creatinine, Ser: 1.1 mg/dL (ref 0.61–1.24)
GLUCOSE: 85 mg/dL (ref 65–99)
POTASSIUM: 3.7 mmol/L (ref 3.5–5.1)
Sodium: 141 mmol/L (ref 135–145)

## 2016-03-07 MED ORDER — SODIUM CHLORIDE 0.9 % IV SOLN
INTRAVENOUS | Status: AC
  Administered 2016-03-07 – 2016-03-08 (×2): via INTRAVENOUS

## 2016-03-07 MED ORDER — POLYETHYLENE GLYCOL 3350 17 G PO PACK
17.0000 g | PACK | Freq: Every day | ORAL | Status: DC
  Administered 2016-03-07: 17 g via ORAL
  Filled 2016-03-07: qty 1

## 2016-03-07 MED ORDER — DOCUSATE SODIUM 100 MG PO CAPS
100.0000 mg | ORAL_CAPSULE | Freq: Two times a day (BID) | ORAL | Status: DC
  Administered 2016-03-07: 100 mg via ORAL
  Filled 2016-03-07 (×2): qty 1

## 2016-03-07 NOTE — Progress Notes (Signed)
Patient ID: Samuel Townsend, male   DOB: [birth year], 23 y.o.   MRN: [id number] [facility] Progress [facility]  Samuel Townsend 25 y.o. [birth year]   Subjective: Continues to have intermittent vomiting mainly with dinner and not breakfast or lunch. Denies abdominal pain. Mother at bedside.  Objective: Vital signs in last 24 hours: Vitals:   03/06/16 1954 03/07/16 0621  BP: 127/71 126/68  Pulse: 92 92  Resp: 19 19  Temp: 98.5 F (36.9 C) 98.4 F (36.9 C)    Physical Exam: Gen: alert, no acute distress, thin, multiple tattoos, pleasant HEENT: anicteric sclera CV: RRR Chest: CTA B Abd: soft, nontender, nondistended, +BS Ext: no edema  Lab Results:  Recent Labs  03/06/16 0615 03/07/16 0300  NA 141 141  K 4.3 3.7  CL 108 105  CO2 26 30  GLUCOSE 77 85  BUN 6 11  CREATININE 1.03 1.10  CALCIUM 8.7* 8.6*    Recent Labs  03/04/16 1529  AST 19  ALT 15*  ALKPHOS 41  BILITOT 0.7  PROT 6.8  ALBUMIN 3.9    Recent Labs  03/06/16 0615 03/07/16 0300  WBC 4.5 5.6  HGB 14.1 14.1  HCT 42.3 43.7  MCV 84.3 85.4  PLT 209 216   No results for input(s): LABPROT, INR in the last 72 hours.    Assessment/Plan: Chronic intermittent vomiting of unclear etiology and negative work up thus far including U/S, CT, EGD, HIDA scan. Denies marijuana. Rare alcohol. Gastroparesis possible although no known risk factors it can occur following a virus or idiopathic. Mother questions whether gallbladder may still be the source despite a normal HIDA scan and U/S. Tried to reassure that GB likely ok from a functional standpoint with the normal HIDA scan. Will make NPO and do gastric emptying scan as next step.   Blossie Raffel C. 03/07/2016, 12:04 PM  Pager [phone]  If no answer or after 5 PM call [phone]

## 2016-03-07 NOTE — Progress Notes (Signed)
Family Medicine Teaching Service Daily Progress Note Intern Pager: [phone]  Patient name: Samuel Townsend Medical record number: [id number] Date of birth: 08/06/1990 Age: 25 y.o. Gender: male  Primary Care Provider: No PCP Per Patient Consultants: GI Code Status: Full   Pt Overview and Major Events to Date:  9/8: Admit to FPTS, IVF and Zofran 9/10: EGD normal > HIDA > normal hepatobiliary exam and gallbladder ejection fraction  Assessment and Plan: Samuel Townsend is a 25 y.o. male presenting with nausea and non-bilious, non-bloody vomiting with intermittent umbilical abdominal pain of 3 week duration. PMH is significant for constipation and anxiety.  Abdominal Pain, Nausea, and Vomiting:  Was able to tolerate all meals for >24hrs without vomiting. Unclear etiology at this point. Differentials still include; anxiety/depression, Irritable Bowel Syndrome (although atypical presentation), Inflammatory Bowel Disease, gastroparesis, manifestation of eating disorder (although unlikely as patient denies any eating disorder and weight loss only recent). TSH and ESR normal. Follows with Dr. Rosalie Gums at [place]. HIV ab non-reactive.  - GI consulted, appreciate their assistance: NPO and gastric emptying scan - IV Zofran q6h  - Continue home meds from outpatient GI (Protonix 40 mg PO daily, Carafate TID with meals) -MIVF while NPO - Tylenol q6h PO PRN pain  Narrowing of Left Renal Vein: Incidental finding on 8/29 CT of abdomen - May need outpatient follow up for further imaging  FEN/GI: MIVF NS @ 75 mL/hr, NPO Prophylaxis: lovenox  Disposition: Continue to monitor for GI symptoms; pending further work-up recommendations from GI  Subjective:  Feels well this morning, did not vomit >24 hrs. Feels this began when he increased his oral intake of food to almost double normal. Also has been intermittently using protein powder, but no other supplements.   Objective: Temp:  [97.9 F (36.6 C)-98.5  F (36.9 C)] 98.4 F (36.9 C) (09/11 0621) Pulse Rate:  [56-92] 92 (09/11 0621) Resp:  [10-22] 19 (09/11 0621) BP: (119-131)/(62-71) 126/68 (09/11 0621) SpO2:  [100 %] 100 % (09/11 0621) Weight:  [135 lb (61.2 kg)] 135 lb (61.2 kg) (09/10 0859) Physical Exam: General: In NAD, thin but muscular, well appearing male laying in bed Cardiovascular: Regular rate and rhythm, no murmurs Respiratory: normal work of breathing, clear to auscultation bilaterally Abdomen: soft, non distended, normal bowel sounds, nontender Extremities: no edema, normal strength in bilateral upper and lower extremities  Laboratory:  Recent Labs Lab 03/05/16 0228 03/06/16 0615 03/07/16 0300  WBC 5.7 4.5 5.6  HGB 13.8 14.1 14.1  HCT 42.4 42.3 43.7  PLT 229 209 216    Recent Labs Lab 03/04/16 1529 03/05/16 0228 03/06/16 0615 03/07/16 0300  NA 139 143 141 141  K 3.9 3.7 4.3 3.7  CL 108 111 108 105  CO2 '27 29 26 30  ' BUN '8 8 6 11  ' CREATININE 1.05 1.00 1.03 1.10  CALCIUM 8.8* 8.4* 8.7* 8.6*  PROT 6.8  --   --   --   BILITOT 0.7  --   --   --   ALKPHOS 41  --   --   --   ALT 15*  --   --   --   AST 19  --   --   --   GLUCOSE 100* 97 77 85   TSH 1.182 ESR: 1  Imaging/Diagnostic Tests: Nm Hepato W/eject Fract  Result Date: 03/06/2016 CLINICAL DATA:  Nausea and vomiting. The patient complains of nausea, vomiting, and upper abdominal pain over the last 3 weeks. EXAM: NUCLEAR  MEDICINE HEPATOBILIARY IMAGING WITH GALLBLADDER EF TECHNIQUE: Sequential images of the abdomen were obtained out to 60 minutes following intravenous administration of radiopharmaceutical. After oral ingestion of Ensure, gallbladder ejection fraction was determined. At 60 min, normal ejection fraction is greater than 33%. RADIOPHARMACEUTICALS:  5.03 mCi Tc-72m Choletec IV COMPARISON:  None. FINDINGS: Prompt uptake and biliary excretion of activity by the liver is seen. Gallbladder activity is visualized, consistent with patency of  cystic duct. Biliary activity passes into small bowel, consistent with patent common bile duct. The patient reported no pain or discomfort following the ingestion of ensure Calculated gallbladder ejection fraction is 86%. (Normal gallbladder ejection fraction with Ensure is greater than 33%.) IMPRESSION: Normal hepatobiliary exam and gallbladder ejection fraction. Electronically Signed   By: CSan MorelleM.D.   On: 03/06/2016 15:54    KSela Hilding MD 03/07/2016, 7:25 AM PGY-1, [place]arlstadtIntern pager: [phone] text pages welcome

## 2016-03-08 ENCOUNTER — Inpatient Hospital Stay (HOSPITAL_COMMUNITY): Payer: 59

## 2016-03-08 LAB — BASIC METABOLIC PANEL
Anion gap: 7 (ref 5–15)
BUN: 13 mg/dL (ref 6–20)
CHLORIDE: 108 mmol/L (ref 101–111)
CO2: 26 mmol/L (ref 22–32)
CREATININE: 1.09 mg/dL (ref 0.61–1.24)
Calcium: 8.5 mg/dL — ABNORMAL LOW (ref 8.9–10.3)
GFR calc non Af Amer: >60 mL/min (ref >60)
GLUCOSE: 81 mg/dL (ref 65–99)
Potassium: 3.9 mmol/L (ref 3.5–5.1)
Sodium: 141 mmol/L (ref 135–145)

## 2016-03-08 MED ORDER — SUCRALFATE 1 GM/10ML PO SUSP
1.0000 g | Freq: Three times a day (TID) | ORAL | Status: DC
  Administered 2016-03-08 – 2016-03-09 (×5): 1 g via ORAL
  Filled 2016-03-08 (×5): qty 10

## 2016-03-08 MED ORDER — POLYETHYLENE GLYCOL 3350 17 G PO PACK
17.0000 g | PACK | Freq: Every day | ORAL | Status: DC
  Filled 2016-03-08: qty 1

## 2016-03-08 MED ORDER — DOCUSATE SODIUM 100 MG PO CAPS
100.0000 mg | ORAL_CAPSULE | Freq: Two times a day (BID) | ORAL | Status: DC
  Filled 2016-03-08 (×2): qty 1

## 2016-03-08 NOTE — Progress Notes (Signed)
Nuclear Medicine staff just called this RN that no medication shall be administered until 12 noon that affects gastric motility due to schedule gastric emptying study.  Patient will be transported to NM by 12 noon.  Will endorse this to day shift RN appropriately.

## 2016-03-08 NOTE — Progress Notes (Signed)
Family Medicine Teaching Service Daily Progress Note Intern Pager: [phone]  Patient name: Samuel Townsend Medical record number: [id number] Date of birth: August 27, 1990 Age: 25 y.o. Gender: male  Primary Care Provider: No PCP Per Patient Consultants: GI Code Status: Full   Pt Overview and Major Events to Date:  9/8: Admit to FPTS, IVF and Zofran 9/10: EGD normal > HIDA > normal hepatobiliary exam and gallbladder ejection fraction 9/12: gastric emptying scan  Assessment and Plan: Samuel Townsend is a 25 y.o. male presenting with nausea and non-bilious, non-bloody vomiting with intermittent umbilical abdominal pain of 3 week duration. PMH is significant for constipation and anxiety.  Abdominal Pain, Nausea, and Vomiting:  Was able to tolerate all meals for >24hrs without vomiting. Unclear etiology at this point. Differentials still include anxiety/depression, Irritable Bowel Syndrome (although atypical presentation), Inflammatory Bowel Disease, gastroparesis, manifestation of eating disorder (although unlikely as patient denies any eating disorder and weight loss only recent). TSH and ESR normal. Follows with Dr. Rosalie Gums at [place]. HIV ab non-reactive.  - GI consulted, appreciate their assistance: NPO and gastric emptying scan - IV Zofran q6h  - holding miralax, carafate, and colace for gastric emptying scan -MIVF while NPO - Tylenol q6h PO PRN pain  Narrowing of Left Renal Vein: Incidental finding on 8/29 CT of abdomen - May need outpatient follow up for further imaging  FEN/GI: MIVF NS @ 75 mL/hr, NPO Prophylaxis: lovenox  Disposition: Continue to monitor for GI symptoms; pending further work-up recommendations from GI  Subjective:  Feels well this morning, did not vomit >48 hrs. Would be interested in staying tomorrow in order to observe vomiting with 3 full meals.  Objective: Temp:  [98.3 F (36.8 C)-98.4 F (36.9 C)] 98.3 F (36.8 C) (09/12 0602) Pulse Rate:  [64-76] 64  (09/12 0602) Resp:  [18-19] 18 (09/12 0602) BP: (98-110)/(60-67) 98/67 (09/12 0602) SpO2:  [100 %] 100 % (09/12 0602) Physical Exam: General: In NAD, thin but muscular, well appearing male laying in bed Cardiovascular: Regular rate and rhythm, no murmurs Respiratory: normal work of breathing, clear to auscultation bilaterally Abdomen: soft, non distended, normal bowel sounds, nontender Extremities: no edema, normal strength in bilateral upper and lower extremities  Laboratory:  Recent Labs Lab 03/05/16 0228 03/06/16 0615 03/07/16 0300  WBC 5.7 4.5 5.6  HGB 13.8 14.1 14.1  HCT 42.4 42.3 43.7  PLT 229 209 216    Recent Labs Lab 03/04/16 1529  03/06/16 0615 03/07/16 0300 03/08/16 0530  NA 139  < > 141 141 141  K 3.9  < > 4.3 3.7 3.9  CL 108  < > 108 105 108  CO2 27  < > '26 30 26  '$ BUN 8  < > '6 11 13  '$ CREATININE 1.05  < > 1.03 1.10 1.09  CALCIUM 8.8*  < > 8.7* 8.6* 8.5*  PROT 6.8  --   --   --   --   BILITOT 0.7  --   --   --   --   ALKPHOS 41  --   --   --   --   ALT 15*  --   --   --   --   AST 19  --   --   --   --   GLUCOSE 100*  < > 77 85 81  < > = values in this interval not displayed. TSH 1.182 ESR: 1  Imaging/Diagnostic Tests: No results found.  Sela Hilding, MD 03/08/2016, 9:29  AM PGY-1, [place] Intern pager: [phone], text pages welcome

## 2016-03-08 NOTE — Consult Note (Signed)
   [facility]   03/08/2016  Samuel Townsend July 28, 1990 [id number]   Came to visit patient at bedside on behalf of [facility] program for [facility] [facility] employees/dependents with [facility] U[facility] insurance. However, he was off the unit upon bedside visit. Link to [employer] packet, contact information, 24-hr nurse line magnet left at bedside. Will follow up at a later time.   Raiford NobleAtika Cavon Nicolls, MSN-Ed, RN,BSN [facility] Care Management Hospital Liaison [phone]

## 2016-03-09 MED ORDER — SUCRALFATE 1 G PO TABS: 1.0000 g | ORAL_TABLET | Freq: Three times a day (TID) | ORAL | 1 refills | Status: DC

## 2016-03-09 MED ORDER — PROCHLORPERAZINE MALEATE 10 MG PO TABS
10.0000 mg | ORAL_TABLET | Freq: Four times a day (QID) | ORAL | 0 refills | Status: DC | PRN
Start: 2016-03-09 — End: 2016-06-30

## 2016-03-09 MED ORDER — LORAZEPAM 1 MG PO TABS: 1.0000 mg | ORAL_TABLET | ORAL | 0 refills | Status: DC | PRN

## 2016-03-09 MED ORDER — DOCUSATE SODIUM 100 MG PO CAPS
100.0000 mg | ORAL_CAPSULE | Freq: Two times a day (BID) | ORAL | 0 refills | Status: DC
Start: 2016-03-09 — End: 2016-07-17

## 2016-03-09 MED ORDER — HYDROCODONE-ACETAMINOPHEN 5-325 MG PO TABS: 1.0000 | ORAL_TABLET | Freq: Four times a day (QID) | ORAL | 0 refills | Status: DC | PRN

## 2016-03-09 MED FILL — SUCRALFATE 1 GM TABLET: 1 | 40 days supply | Qty: 120 | Fill #0

## 2016-03-09 MED FILL — PROCHLORPERAZINE 10 MG TAB: 10 | 8 days supply | Qty: 30 | Fill #0

## 2016-03-09 NOTE — Progress Notes (Signed)
[facility]  Merrilee JanskyKweisi D Delamater 25 y.o. 01/05/1991   Subjective: Tolerating solid food. Denies vomiting. Mother at bedside.  Objective: Vital signs in last 24 hours: Vitals:   03/09/16 0625 03/09/16 1226  BP: 112/61 129/62  Pulse: 60 72  Resp: 16 16  Temp: 98.1 F (36.7 C) 98.1 F (36.7 C)    Physical Exam: Gen: alert, no acute distress, thin HEENT: anicteric sclera CV: RRR Chest: CTA B Abd: soft, nontender, nondistended, +BS  Lab Results:  Recent Labs  03/07/16 0300 03/08/16 0530  NA 141 141  K 3.7 3.9  CL 105 108  CO2 30 26  GLUCOSE 85 81  BUN 11 13  CREATININE 1.10 1.09  CALCIUM 8.6* 8.5*   No results for input(s): AST, ALT, ALKPHOS, BILITOT, PROT, ALBUMIN in the last 72 hours.  Recent Labs  03/07/16 0300  WBC 5.6  HGB 14.1  HCT 43.7  MCV 85.4  PLT 216   No results for input(s): LABPROT, INR in the last 72 hours.    Assessment/Plan: Intermittent vomiting - normal gastric emptying scan; negative work up. Cyclical vomiting syndrome likely source. Two meals per day unless can tolerate 3 meals per day. A[facility]. Will sign off. F/U with Dr. Georgiann CockerBrahmbatt in October/Novemer (appt tomorrow has been cancelled).   Amie Cowens C. 03/09/2016, 2:02 PM  Pager [phone]  If no answer or after 5 PM call [phone] ID: Merrilee JanskyKweisi D Bauman, male   DOB: 01/05/1991, 25 y.o.   MRN: [id number]

## 2016-03-09 NOTE — Progress Notes (Signed)
Discharge instructions gone over with patient and mother. All questions answered. Education given on new medications and when to seek further medical help.  Prescriptions send home with patient. Patient walked by nurse downstairs and driven home by mother.  Cindee SaltMcBride,Amaar Oshita K, RN

## 2016-03-09 NOTE — Progress Notes (Signed)
Family Medicine Teaching Service Daily Progress Note Intern Pager: [phone]  Patient name: Samuel Townsend Medical record number: [id number] Date of birth: [birth year] Age: 25 y.o. Gender: male  Primary Care Provider: No PCP Per Patient Consultants: GI Code Status: Full   Pt Overview and Major Events to Date:  9/8: Admit to FPTS, IVF and Zofran 9/10: EGD normal > HIDA > normal hepatobiliary exam and gallbladder ejection fraction 9/12: gastric emptying scan normal  Assessment and Plan: LINTON STOLP is a 25 y.o. male presenting with nausea and non-bilious, non-bloody vomiting with intermittent umbilical abdominal pain of 3 week duration. PMH is significant for constipation and anxiety.  Abdominal Pain, Nausea, and Vomiting:  Was able to tolerate all meals for >72 hrs without vomiting. Based on normal workup from GI, our leading suspicion is abdominal migraine or cyclic vomiting syndrome. TSH and ESR normal. Follows with Dr. Rosalie Gums at [place]. HIV ab non-reactive.  - GI consulted, appreciate their assistance: pending final recs -consider outpatient referral to Dr. Derrill Kay at [facility], a neurodigestive specialist - will discharge with ativan, compazine, and norco PRN for future abdominal migraines - IV Zofran q6h  - PRN miralax, carafate, and colace  - Tylenol q6h PO PRN pain  Narrowing of Left Renal Vein: Incidental finding on 8/29 CT of abdomen - May need outpatient follow up for further imaging  FEN/GI: regular diet, PPI Prophylaxis: lovenox  Disposition: discharge today  Subjective:  Feels well this morning, did not vomit >72 hrs. Would like to stay for dinner today to see how that goes.  Objective: Temp:  [97.9 F (36.6 C)-98.2 F (36.8 C)] 98.1 F (36.7 C) (09/13 0625) Pulse Rate:  [60-69] 60 (09/13 0625) Resp:  [16-18] 16 (09/13 0625) BP: (107-121)/(61-75) 112/61 (09/13 0625) SpO2:  [100 %] 100 % (09/13 9163) Physical Exam: General: In NAD, thin but  muscular, well appearing male laying in bed Cardiovascular: Regular rate and rhythm, no murmurs Respiratory: normal work of breathing, clear to auscultation bilaterally Abdomen: soft, non distended, normal bowel sounds, nontender Extremities: no edema, normal strength in bilateral upper and lower extremities  Laboratory:  Recent Labs Lab 03/05/16 0228 03/06/16 0615 03/07/16 0300  WBC 5.7 4.5 5.6  HGB 13.8 14.1 14.1  HCT 42.4 42.3 43.7  PLT 229 209 216    Recent Labs Lab 03/04/16 1529  03/06/16 0615 03/07/16 0300 03/08/16 0530  NA 139  < > 141 141 141  K 3.9  < > 4.3 3.7 3.9  CL 108  < > 108 105 108  CO2 27  < > _0 BUN 8  < > _1 CREATININE 1.05  < > 1.03 1.10 1.09  CALCIUM 8.8*  < > 8.7* 8.6* 8.5*  PROT 6.8  --   --   --   --   BILITOT 0.7  --   --   --   --   ALKPHOS 41  --   --   --   --   ALT 15*  --   --   --   --   AST 19  --   --   --   --   GLUCOSE 100*  < > 77 85 81  < > = values in this interval not displayed. TSH 1.182 ESR: 1  Imaging/Diagnostic Tests: Nm Gastric Emptying  Result Date: 03/08/2016 CLINICAL DATA:  Abdomen pain after dinner for 3 weeks. EXAM: NUCLEAR MEDICINE GASTRIC EMPTYING SCAN TECHNIQUE: After  oral ingestion of radiolabeled meal, sequential abdominal images were obtained for 4 hours. Percentage of activity emptying the stomach was calculated at 1 hour, 2 hour, 3 hour, and 4 hours. RADIOPHARMACEUTICALS:  2.1 millicurie mCi MH-68G sulfur colloid in standardized meal COMPARISON:  None. FINDINGS: Expected location of the stomach in the left upper quadrant. Ingested meal empties the stomach gradually over the course of the study. 11% emptied at 1 hr ( normal >= 10%) 61% emptied at 2 hr ( normal >= 40%) 81% emptied at 3 hr ( normal >= 70%) 94% emptied at 4 hr ( normal >= 90%) IMPRESSION: Normal  gastric emptying study. Electronically Signed   By: Abelardo Diesel M.D.   On: 03/08/2016 17:43    Sela Hilding, MD 03/09/2016, 9:36  AM PGY-1, [place] Intern pager: [phone], text pages welcome

## 2016-03-09 NOTE — Consult Note (Signed)
   [facility]   03/09/2016  Samuel JanskyKweisi D Kam [birth year] [id number]   [facility] to Wellness follow up for [name] employees/dependents with [facility] U[employer] insurance. Telephone call made into room. Spoke with mom who is [employer] employee. States Link to [employer]  packet and contact information was received. Confirmed best contact number for Mr. Samuel PlummerMorrow as [phone] for post hospital follow up call.   Raiford NobleAtika Zaiden Ludlum, MSN-Ed, RN,BSN [facility] Care Management Hospital Liaison [phone]

## 2016-03-10 ENCOUNTER — Other Ambulatory Visit: Payer: Self-pay | Admitting: *Deleted

## 2016-03-10 MED FILL — LORazepam 1 MG TABS: 1 | 5 days supply | Qty: 30 | Fill #0

## 2016-03-10 MED FILL — HYDROCODON-APAP 5-325: 5-325 | 8 days supply | Qty: 30 | Fill #0

## 2016-03-10 NOTE — Patient Outreach (Addendum)
[facility] Care Management  03/10/2016  Samuel Townsend Samuel Townsend 1991-06-21 [id number]   Subjective: Telephone call to patient's home / mobile number, no answer, left HIPAA compliant voicemail message, and requested call back.   Objective: Per chart review: Patient hospitalized 03/04/16 -03/09/16 with Nausea, Vomiting and abdominal pain.  Patient has ED visits 02/18/16, 02/22/16, and 02/26/16 related to nausea, vomiting, abdominal pain, and head injury after fall hitting head.     Assessment: Received UMR Transition of care referral on 03/09/16.   Telephone screen / transition of care follow up, pending patient contact.   Plan: RNCM will call patient for 2nd telephonic outreach attempt, telephone screen / transition of care follow up, within 10 business days, if no return call.    Samuel Pro H. Gardiner Barefootooper RN, BSN, CCM [facility] Care Management [facility] Telephonic CM Phone: [phone] Fax: [phone]
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
## 2016-03-11 ENCOUNTER — Ambulatory Visit: Payer: Self-pay | Admitting: *Deleted

## 2016-03-11 ENCOUNTER — Other Ambulatory Visit: Payer: Self-pay | Admitting: *Deleted

## 2016-03-11 NOTE — Patient Outreach (Signed)
[facility] Care Management  03/11/2016  Samuel Townsend 22-Jul-1990 [id number]   Subjective: Telephone call to patient's home / mobile number, no answer, left HIPAA compliant voicemail message, and requested call back.   Objective: Per chart review: Patient hospitalized 03/04/16 -03/09/16 with Nausea, Vomiting and abdominal pain.  Patient has ED visits 02/18/16, 02/22/16, and 02/26/16 related to nausea, vomiting, abdominal pain, and head injury after fall hitting head.     Assessment: Received UMR Transition of care referral on 03/09/16. Telephone screen / transition of care follow up, pending patient contact.   Plan: RNCM will call patient for 3rd telephonic outreach attempt, telephone screen / transition of care follow up, within 10 business days, if no return call.    Samuel Townsend H. Gardiner Barefootooper RN, BSN, CCM [facility] Care Management [facility] Telephonic CM Phone: [phone] Fax: [phone]

## 2016-03-14 ENCOUNTER — Encounter: Payer: Self-pay | Admitting: *Deleted

## 2016-03-14 ENCOUNTER — Other Ambulatory Visit: Payer: Self-pay | Admitting: *Deleted

## 2016-03-14 ENCOUNTER — Ambulatory Visit: Payer: Self-pay | Admitting: *Deleted

## 2016-03-14 NOTE — Patient Outreach (Addendum)
[facility] Care Management  03/14/2016  Samuel Townsend 06-Jul-1990 [id number]   Subjective: Telephone call to patient's home / mobile number, spoke with patient, and HIPAA verified.   Discussed [facility] Transition of care follow up and program services.   Patient states he is feeling much better and the medications have really made a difference.  States he has a follow up appointment with his new primary MD (Dr. Tawanna Coolerodd Mcdiarmid) on 03/21/16.  States his previous primary MD has left the practice and he has not establish care with the MD that took over the practice.  Patient states he [facility] for this medications and does not have the supplemental [facility](Hospital Indemnity) insurance.  States he is no longer an employee of [employer] and is covered under his mother's insurance and she is a [employer] employee.  Patient states he does not have any transition of care, care coordination, disease management, disease monitoring, transportation, community resource, or pharmacy needs at this time.   States he is appreciative of the of the follow call and is in agreement to receive successful outreach letter.   States he already has a [facility] Care [facility] packet and 24 hour Nurse Advice line magnet.  Telephone call to Iverson AlaminLaura Greeson at [facility] advised of patient's primary MD contact information and Vernona RiegerLaura updated chart.   Objective: Per chart review: Patient hospitalized 03/04/16 -03/09/16 with Nausea, Vomiting and abdominal pain. Patient has ED visits 02/18/16, 02/22/16, and 02/26/16 related to nausea, vomiting, abdominal pain, and head injury after fall hitting head.    Assessment: Received [facility] Transition of care referral on 03/09/16. Telephone screen / transition of care follow up completed, no care management needs, and will proceed with case closure.   Plan: RNCM will send patient successful outreach letter. RNCM will send case closure due to  follow up completed/ no care management needs request to Iverson AlaminLaura Greeson at [facility].   Raul Winterhalter H. Gardiner Barefootooper RN, BSN, CCM [facility] Care Management [facility] Telephonic CM Phone: [phone] Fax: [phone]

## 2016-03-21 ENCOUNTER — Ambulatory Visit (INDEPENDENT_AMBULATORY_CARE_PROVIDER_SITE_OTHER): Payer: 59 | Admitting: Family Medicine

## 2016-03-21 ENCOUNTER — Encounter: Payer: Self-pay | Admitting: Student

## 2016-03-21 VITALS — BP 121/69 | HR 84 | Ht 75.0 in | Wt 133.0 lb

## 2016-03-21 DIAGNOSIS — K929 Disease of digestive system, unspecified: Secondary | ICD-10-CM

## 2016-03-21 DIAGNOSIS — K30 Functional dyspepsia: Secondary | ICD-10-CM

## 2016-03-21 DIAGNOSIS — F419 Anxiety disorder, unspecified: Secondary | ICD-10-CM | POA: Diagnosis not present

## 2016-03-21 MED ORDER — CITALOPRAM HYDROBROMIDE 10 MG PO TABS: 10.0000 mg | ORAL_TABLET | Freq: Every day | ORAL | 0 refills | Status: DC

## 2016-03-21 MED FILL — CITALOPRAM HBR 10 MG TABLET: 10 | 30 days supply | Qty: 30 | Fill #0

## 2016-03-21 NOTE — Patient Instructions (Signed)
If you are interesting in learning how to eat healthier, call the [facility] Nutritionist, Dr Wyona AlmasJeannie Sykes, to arrange a possible visit with her.  Her office phone number is [phone].  She sees patients at the [facility].   Try taking the Compazine first for your nausea, then if you still need further help, take the Ativan (Lorazepam)  You are starting an antidepressant that also is an Anxiolytic.  Your dose is very low.  We will see how well you tolerate it before increasing slowly towards 40 mg a day.  Once at this full dose, iot may take 4 to 8 weeks to have its best effects on your anxiety.  If the citalopram causes you to feel distressed, stop it and let Dr McDiarmid know.    Watch for constipation with this medication

## 2016-03-22 ENCOUNTER — Encounter: Payer: Self-pay | Admitting: Family Medicine

## 2016-03-22 DIAGNOSIS — F410 Panic disorder [episodic paroxysmal anxiety] without agoraphobia: Secondary | ICD-10-CM

## 2016-03-22 HISTORY — DX: Panic disorder (episodic paroxysmal anxiety): F41.0

## 2016-03-22 NOTE — Assessment & Plan Note (Signed)
New problem No further workup now. Mr Samuel Townsend is to consult with [facility] motility clinic (Dr Alycia RossettiKoch) in January 2018.  Nausea/vomiting predominating in cycles of illness with secondary abdominal pain.  Normal interictal GI experience periods.   Encouraged Mr Samuel Townsend to use Compazine as his initial abortive medication during prodrome phase with Lorazepam only as a secondary rescue if initial abortive fails.  His use of Norco is minimal and reassuring.   Will discuss use of a prophylactic medication e.g. TCA, Beta-blocker, AED in future if frequent use of abortive medications during. Supportive therapy should abortive occur will be a start of IV fluids with IV administration of abortive medication cocktail.

## 2016-03-22 NOTE — Assessment & Plan Note (Signed)
Differential diagnosis Panic Disorder, GAD, PTSD Doubt Acute Stress Disorder b/c while this recent exacerbation of N/V seems to be part of a pattern that goes back years.   Working differential is GAD Vs PTSD.  Both may respond to SS/NRI. Start very low dose SSRI, Citalopram 10 mg daily.  RTC in 2 weeks to assess tolerance.  If tolerating, will start titration up to effective dose range of 20 to 40 mg daily.  [name] is continuing his counseling and journaling.
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
## 2016-03-24 ENCOUNTER — Ambulatory Visit: Payer: 59 | Admitting: Emergency Medicine

## 2016-03-25 ENCOUNTER — Encounter: Payer: Self-pay | Admitting: Family Medicine

## 2016-03-25 ENCOUNTER — Ambulatory Visit (INDEPENDENT_AMBULATORY_CARE_PROVIDER_SITE_OTHER): Payer: 59 | Admitting: Family Medicine

## 2016-03-25 DIAGNOSIS — K929 Disease of digestive system, unspecified: Secondary | ICD-10-CM

## 2016-03-25 DIAGNOSIS — R110 Nausea: Secondary | ICD-10-CM | POA: Diagnosis not present

## 2016-03-25 DIAGNOSIS — K920 Hematemesis: Secondary | ICD-10-CM

## 2016-03-25 DIAGNOSIS — R636 Underweight: Secondary | ICD-10-CM | POA: Diagnosis not present

## 2016-03-25 DIAGNOSIS — K30 Functional dyspepsia: Secondary | ICD-10-CM | POA: Diagnosis not present

## 2016-03-25 MED ORDER — HYDROXYZINE PAMOATE 25 MG PO CAPS: 25.0000 mg | ORAL_CAPSULE | Freq: Three times a day (TID) | ORAL | 6 refills | Status: DC | PRN

## 2016-03-25 MED FILL — HYDROXYZINE PAM 25 MG CAP: 25 | 30 days supply | Qty: 90 | Fill #0

## 2016-03-25 NOTE — Assessment & Plan Note (Signed)
Limit further diagnostic tests.  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  Focus on treatment - he sounds like he needs some chronic daily medication to prevent spells since they are so short lived.  Only been on citalopram one day.    Because of more immediate effect, I will start hydroxyzine.  Hold citalopram to avoid confusion.  Restart citalopram after we are clear on the effects of hydroxyzine.

## 2016-03-25 NOTE — Assessment & Plan Note (Signed)
Stopped and hemodynamically stable.  Likely mallory weiss. Recent normal EGD.

## 2016-03-25 NOTE — Patient Instructions (Addendum)
In my mind, this is a syndrome - maybe abdominal migraine, maybe cyclic vomiting, maybe irritable bowel syndrome.   Focus will be on a medication regimen that controls your symptoms. Your body is telling you that you are under too much stress right now.  How are you going to rearrange your life to decrease that stress. Stay away from surgeons. Start this new medicine and stop the citalopram for now - the new medicine is quicker acting.   You should know how this medicine is affecting you in a couple of weeks.   Likely restart the citalopram in 2 weeks.  The only reason I am holding for now is to avoid confusion. [facility]. I ordered two weird blood tests to look for gluten enteropathy also known as celiac disease.
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
## 2016-03-25 NOTE — Assessment & Plan Note (Signed)
Wt is drifting down adds some concern.  Clearly explained by N&V.  Because of this, I will pursue the remote possibility of gluten enteropathy.

## 2016-03-28 LAB — TISSUE TRANSGLUTAMINASE, IGA: Tissue Transglutaminase Ab, IgA: 1 U/mL (ref <4)

## 2016-03-28 LAB — TISSUE TRANSGLUTAMINASE, IGG: TISSUE TRANSGLUT AB: 1 U/mL (ref <6)

## 2016-04-18 ENCOUNTER — Encounter: Payer: Self-pay | Admitting: Family Medicine

## 2016-04-18 NOTE — Progress Notes (Signed)
Form for ADA Accommodtion from [employer]. Request for clarify "8 - 72 hrs" for support for intermittent leave to make a determination.  Dates Dr McDiarmid supoorts for Continuous leave from 03/04/16 to 03/10/16 Dates Dr McDiarmid supports for intermittent leave from 03/11/16 thru 03/10/2017  Intermittent leave needed for treatments - Yes How often would the employee need to be off work for this? 2 times every 1 month Each treatment may last 72 hours I  Is intermittent leave needed for incapacity to make it to work - Yes How often would the employee need time off ffor this? 2 times every 1 month Episodes may last 72 hours or 3 days.   The functional limitations during incapacity inability to perform work due to bedrest therapy.   Fax'd form to [employer] on 04/18/16

## 2016-05-03 ENCOUNTER — Telehealth: Payer: Self-pay | Admitting: Family Medicine

## 2016-05-03 NOTE — Telephone Encounter (Signed)
Is [facility] a [facility]? If not, double book Emmauel in a 4:30 pm appointment with in [facility].  Thank you.

## 2016-05-03 NOTE — Telephone Encounter (Signed)
Pt called to schedule an appointment to see his doctor but nothing is available until 2018. He said that the doctor sees him usually after 4:30 pm. He would like to know when he can do this. Please call him with the time and day. jw

## 2016-05-16 ENCOUNTER — Other Ambulatory Visit: Payer: Self-pay | Admitting: Family Medicine

## 2016-05-16 NOTE — Telephone Encounter (Signed)
Patient asks refill for Lorazepan 1 mg. Please, follow up.

## 2016-05-17 MED ORDER — LORAZEPAM 1 MG PO TABS: 1.0000 mg | ORAL_TABLET | ORAL | 0 refills | Status: DC | PRN

## 2016-05-17 MED FILL — LORazepam 1 MG TABS: 1 | 5 days supply | Qty: 30 | Fill #0

## 2016-06-30 ENCOUNTER — Ambulatory Visit (INDEPENDENT_AMBULATORY_CARE_PROVIDER_SITE_OTHER): Payer: 59 | Admitting: Family Medicine

## 2016-06-30 ENCOUNTER — Encounter: Payer: Self-pay | Admitting: Family Medicine

## 2016-06-30 VITALS — BP 118/78 | HR 78 | Temp 98.3°F | Ht 75.0 in | Wt 129.0 lb

## 2016-06-30 DIAGNOSIS — F4310 Post-traumatic stress disorder, unspecified: Secondary | ICD-10-CM

## 2016-06-30 DIAGNOSIS — K929 Disease of digestive system, unspecified: Secondary | ICD-10-CM | POA: Diagnosis not present

## 2016-06-30 DIAGNOSIS — F413 Other mixed anxiety disorders: Secondary | ICD-10-CM

## 2016-06-30 MED ORDER — PROCHLORPERAZINE MALEATE 10 MG PO TABS: 10.0000 mg | ORAL_TABLET | Freq: Four times a day (QID) | ORAL | 2 refills | Status: DC | PRN

## 2016-06-30 MED ORDER — CITALOPRAM HYDROBROMIDE 20 MG PO TABS: 20.0000 mg | ORAL_TABLET | Freq: Every day | ORAL | 2 refills | Status: DC

## 2016-06-30 MED ORDER — CYPROHEPTADINE HCL 4 MG PO TABS: 8.0000 mg | ORAL_TABLET | Freq: Three times a day (TID) | ORAL | 2 refills | Status: DC | PRN

## 2016-06-30 MED FILL — CYPROHEPTADINE 4 MG TABLET: 4 | 5 days supply | Qty: 30 | Fill #0

## 2016-06-30 MED FILL — CITALOPRAM HBR 20 MG TABLET: 20 | 30 days supply | Qty: 30 | Fill #0

## 2016-06-30 NOTE — Patient Instructions (Signed)
I am glad you came into the office today.  It was good to see you.  See if you can move up your appointment with Mr Mayford KnifeWilliams.  If you want to speak with a counselor at the [facility], please call our office and leave a message for Dr Lenward Able to arrange the counselor to contact you.   Stop the hydroxyzine medicine.  Start the Cyproheptadine.  Sequence of treatment for a stomach attack 1. Start with dose of Compazine If unrelieved after 90 minutesr,  2. Take dose of Cyproheptadine If still unrelieved after 90 minutes, 3. Take dose of Ativan If still unrelieved after 90 minutes, 4. Restart the cycle of medications, starting with Compazine  If unrelieved after two cycles of the medications, contact Dr Valor Quaintance's office, or proceed to the Emergency room as you may need IV fluids.    Stop taking Citalopram 10 mg daily. Start taking Citalopram to 20 mg daily.  This is to decrease your anxiety.  It will take a 3 to 4 weeks before you will see improvement in anxiety.  You may need to increase to 40 mg daily eventually.

## 2016-07-01 ENCOUNTER — Encounter: Payer: Self-pay | Admitting: Family Medicine

## 2016-07-01 DIAGNOSIS — F4310 Post-traumatic stress disorder, unspecified: Secondary | ICD-10-CM

## 2016-07-01 HISTORY — DX: Post-traumatic stress disorder, unspecified: F43.10
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
## 2016-07-04 MED FILL — PROCHLORPERAZINE 10 MG TAB: 10 | 13 days supply | Qty: 40 | Fill #0

## 2016-07-13 DIAGNOSIS — R112 Nausea with vomiting, unspecified: Secondary | ICD-10-CM | POA: Diagnosis not present

## 2016-07-13 DIAGNOSIS — F419 Anxiety disorder, unspecified: Secondary | ICD-10-CM | POA: Diagnosis not present

## 2016-07-13 DIAGNOSIS — F329 Major depressive disorder, single episode, unspecified: Secondary | ICD-10-CM | POA: Diagnosis not present

## 2016-07-13 DIAGNOSIS — F4310 Post-traumatic stress disorder, unspecified: Secondary | ICD-10-CM | POA: Diagnosis not present

## 2016-07-13 MED FILL — PANTOPRAZOLE SOD DR 40 MG T: 40 | 30 days supply | Qty: 60 | Fill #0

## 2016-07-14 ENCOUNTER — Inpatient Hospital Stay (HOSPITAL_COMMUNITY)
Admission: AD | Admit: 2016-07-14 | Discharge: 2016-07-17 | DRG: 885 | Disposition: A | Discharge status: Home / Self Care | Payer: 59 | Attending: Psychiatry | Admitting: Psychiatry

## 2016-07-14 ENCOUNTER — Inpatient Hospital Stay (HOSPITAL_COMMUNITY): Admission: EM | Admit: 2016-07-14 | Payer: 59 | Source: Home / Self Care | Admitting: Psychiatry

## 2016-07-14 ENCOUNTER — Encounter (HOSPITAL_COMMUNITY): Payer: Self-pay

## 2016-07-14 DIAGNOSIS — F329 Major depressive disorder, single episode, unspecified: Secondary | ICD-10-CM | POA: Diagnosis present

## 2016-07-14 DIAGNOSIS — F339 Major depressive disorder, recurrent, unspecified: Principal | ICD-10-CM | POA: Diagnosis present

## 2016-07-14 DIAGNOSIS — Z79899 Other long term (current) drug therapy: Secondary | ICD-10-CM | POA: Diagnosis not present

## 2016-07-14 DIAGNOSIS — Z833 Family history of diabetes mellitus: Secondary | ICD-10-CM | POA: Diagnosis not present

## 2016-07-14 DIAGNOSIS — F4310 Post-traumatic stress disorder, unspecified: Secondary | ICD-10-CM | POA: Diagnosis not present

## 2016-07-14 DIAGNOSIS — R45851 Suicidal ideations: Secondary | ICD-10-CM | POA: Diagnosis present

## 2016-07-14 DIAGNOSIS — Z818 Family history of other mental and behavioral disorders: Secondary | ICD-10-CM

## 2016-07-14 DIAGNOSIS — Z801 Family history of malignant neoplasm of trachea, bronchus and lung: Secondary | ICD-10-CM | POA: Diagnosis not present

## 2016-07-14 DIAGNOSIS — Z9889 Other specified postprocedural states: Secondary | ICD-10-CM | POA: Diagnosis not present

## 2016-07-14 DIAGNOSIS — Z823 Family history of stroke: Secondary | ICD-10-CM | POA: Diagnosis not present

## 2016-07-14 DIAGNOSIS — Z8249 Family history of ischemic heart disease and other diseases of the circulatory system: Secondary | ICD-10-CM | POA: Diagnosis not present

## 2016-07-14 MED ORDER — ALUM & MAG HYDROXIDE-SIMETH 200-200-20 MG/5ML PO SUSP
30.0000 mL | ORAL | Status: DC | PRN
Start: 2016-07-14 — End: 2016-07-17

## 2016-07-14 MED ORDER — PROCHLORPERAZINE MALEATE 10 MG PO TABS: 10.0000 mg | ORAL_TABLET | Freq: Four times a day (QID) | ORAL | Status: DC | PRN

## 2016-07-14 MED ORDER — LORAZEPAM 1 MG PO TABS: 1.0000 mg | ORAL_TABLET | ORAL | Status: DC | PRN

## 2016-07-14 MED ORDER — CITALOPRAM HYDROBROMIDE 20 MG PO TABS
20.0000 mg | ORAL_TABLET | Freq: Every day | ORAL | Status: DC
  Administered 2016-07-15 – 2016-07-17 (×3): 20 mg via ORAL
  Filled 2016-07-14 (×5): qty 1

## 2016-07-14 MED ORDER — MAGNESIUM HYDROXIDE 400 MG/5ML PO SUSP: 30.0000 mL | Freq: Every day | ORAL | Status: DC | PRN

## 2016-07-14 MED ORDER — ENSURE ENLIVE PO LIQD: 237.0000 mL | Freq: Two times a day (BID) | ORAL | Status: DC

## 2016-07-14 MED ORDER — TRAZODONE HCL 50 MG PO TABS
50.0000 mg | ORAL_TABLET | Freq: Every evening | ORAL | Status: DC | PRN
  Filled 2016-07-14 (×10): qty 1

## 2016-07-14 MED ORDER — ACETAMINOPHEN 325 MG PO TABS: 650.0000 mg | ORAL_TABLET | Freq: Four times a day (QID) | ORAL | Status: DC | PRN

## 2016-07-14 MED ORDER — CYPROHEPTADINE HCL 4 MG PO TABS: 8.0000 mg | ORAL_TABLET | Freq: Three times a day (TID) | ORAL | Status: DC | PRN

## 2016-07-14 NOTE — Tx Team (Signed)
Initial Treatment Plan 07/14/2016 9:27 PM Samuel JanskyKweisi [name] JXB:[id number]    PATIENT STRESSORS: Marital or family conflict Medication change or noncompliance   PATIENT STRENGTHS: Average or above average intelligence Capable of independent living General fund of knowledge Supportive family/friends   PATIENT IDENTIFIED PROBLEMS: "get help with my depression"                     DISCHARGE CRITERIA:  Improved stabilization in mood, thinking, and/or behavior Reduction of life-threatening or endangering symptoms to within safe limits Verbal commitment to aftercare and medication compliance  PRELIMINARY DISCHARGE PLAN: Attend aftercare/continuing care group Return to previous living arrangement Return to previous work or school arrangements  PATIENT/FAMILY INVOLVEMENT: This treatment plan has been presented to and reviewed with the patient, Samuel Townsend, and/or family member, .  The patient and family have been given the opportunity to ask questions and make suggestions.  Andrena Mewsuttall, [name], RN 07/14/2016, 9:27 PM
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
## 2016-07-14 NOTE — H&P (Signed)
Behavioral Health Medical Screening Exam  Samuel Townsend is an 26 y.o. male.  Total Time spent with patient: 15 minutes  Psychiatric Specialty Exam: Physical Exam  Constitutional: He is oriented to person, place, and time. He appears well-developed and well-nourished. No distress.  HENT:  Head: Normocephalic and atraumatic.  Right Ear: External ear normal.  Left Ear: External ear normal.  Eyes: Conjunctivae are normal. Right eye exhibits no discharge. Left eye exhibits no discharge. No scleral icterus.  Cardiovascular: Normal rate.   Respiratory: Effort normal. No respiratory distress.  Musculoskeletal: Normal range of motion.  Neurological: He is alert and oriented to person, place, and time.  Skin: Skin is warm and dry. He is not diaphoretic.  Psychiatric: His speech is normal. His mood appears anxious. His affect is not blunt, not labile and not inappropriate. He is not actively hallucinating. Thought content is not paranoid and not delusional. Cognition and memory are normal. He expresses impulsivity and inappropriate judgment. He exhibits a depressed mood. He expresses no homicidal and no suicidal ideation.    Review of Systems  Constitutional: Positive for malaise/fatigue and weight loss.  Gastrointestinal: Positive for nausea and vomiting.  Neurological: Positive for weakness.  Psychiatric/Behavioral: Positive for depression. Negative for hallucinations, substance abuse and suicidal ideas. The patient is nervous/anxious and has insomnia.   All other systems reviewed and are negative.   Blood pressure 118/74, pulse 72, temperature 99 F (37.2 C), resp. rate 18, SpO2 100 %.There is no height or weight on file to calculate BMI.  General Appearance: Casual  Eye Contact:  Fair  Speech:  Clear and Coherent  Volume:  Normal  Mood:  Anxious, Depressed, Hopeless and Worthless  Affect:  Depressed  Thought Process:  Coherent  Orientation:  Full (Time, Place, and Person)  Thought  Content:  Logical and Hallucinations: None  Suicidal Thoughts:  No  Homicidal Thoughts:  No  Memory:  Immediate;   Good Recent;   Good Remote;   Good  Judgement:  Fair  Insight:  Fair  Psychomotor Activity:  Normal  Concentration: Concentration: Fair and Attention Span: Fair  Recall:  Good  Fund of Knowledge:Good  Language: Good  Akathisia:  NA  Handed:  Right  AIMS (if indicated):     Assets:  Communication Skills Desire for Improvement Financial Resources/Insurance Housing Physical Health Social Support  Sleep:       Musculoskeletal: Strength & Muscle Tone: within normal limits Gait & Station: normal Patient leans: N/A  Blood pressure 118/74, pulse 72, temperature 99 F (37.2 C), resp. rate 18, SpO2 100 %.  Recommendations:  Based on my evaluation the patient does not appear to have an emergency medical condition. Patient was accepted for admission by Elta GuadeloupeLaurie Parks, FNP  Jackelyn PolingJason A Brax Walen, NP 07/14/2016, 9:01 PM

## 2016-07-14 NOTE — Progress Notes (Signed)
Pt is a 26 year old male admitted with depression and anxiety    He said he was depressed for over a month and went to the doctor but his depression has gotten worse since the break up of his relationship about a week ago and has been thinking about killing himself and does not feel safe outside the hospital    Pt has lost weight due to lack of appetite .  Pt said his lack of appetite is related to the medication he is taking    He said he has only been taking the medication for 4 weeks and really cant tell much difference   He is depressed and tearful during the assessment but is cooperative and calm      Pt was oriented to the unit   His assessment completed and orders received    Verbal support and encouragement given   Medications were educated on but pt said he took his ativan 1 mg prior to coming to the hospital and he is very sleepy   He was yawning during the assessment   Pt said he would not need anymore medications for the night   Q    15 min checks started    Pt is adjusting well and is presently safe

## 2016-07-15 ENCOUNTER — Encounter (HOSPITAL_COMMUNITY): Payer: Self-pay | Admitting: Psychiatry

## 2016-07-15 DIAGNOSIS — F339 Major depressive disorder, recurrent, unspecified: Secondary | ICD-10-CM | POA: Diagnosis present

## 2016-07-15 DIAGNOSIS — Z801 Family history of malignant neoplasm of trachea, bronchus and lung: Secondary | ICD-10-CM

## 2016-07-15 DIAGNOSIS — Z91018 Allergy to other foods: Secondary | ICD-10-CM

## 2016-07-15 DIAGNOSIS — Z818 Family history of other mental and behavioral disorders: Secondary | ICD-10-CM

## 2016-07-15 DIAGNOSIS — Z833 Family history of diabetes mellitus: Secondary | ICD-10-CM

## 2016-07-15 DIAGNOSIS — Z823 Family history of stroke: Secondary | ICD-10-CM

## 2016-07-15 DIAGNOSIS — Z8249 Family history of ischemic heart disease and other diseases of the circulatory system: Secondary | ICD-10-CM

## 2016-07-15 DIAGNOSIS — Z79899 Other long term (current) drug therapy: Secondary | ICD-10-CM

## 2016-07-15 DIAGNOSIS — Z9889 Other specified postprocedural states: Secondary | ICD-10-CM

## 2016-07-15 DIAGNOSIS — F4310 Post-traumatic stress disorder, unspecified: Secondary | ICD-10-CM

## 2016-07-15 HISTORY — DX: Major depressive disorder, recurrent, unspecified: F33.9

## 2016-07-15 LAB — CBC
HCT: 46.4 % (ref 39.0–52.0)
Hemoglobin: 15.5 g/dL (ref 13.0–17.0)
MCH: 27.3 pg (ref 26.0–34.0)
MCHC: 33.4 g/dL (ref 30.0–36.0)
MCV: 81.8 fL (ref 78.0–100.0)
Platelets: 192 10*3/uL (ref 150–400)
RBC: 5.67 MIL/uL (ref 4.22–5.81)
RDW: 12.9 % (ref 11.5–15.5)
WBC: 4.5 10*3/uL (ref 4.0–10.5)

## 2016-07-15 LAB — RAPID URINE DRUG SCREEN, HOSP PERFORMED
Amphetamines: NOT DETECTED
BARBITURATES: NOT DETECTED
Benzodiazepines: POSITIVE — AB
Cocaine: NOT DETECTED
OPIATES: NOT DETECTED
TETRAHYDROCANNABINOL: NOT DETECTED

## 2016-07-15 LAB — TSH: TSH: 0.901 u[IU]/mL (ref 0.350–4.500)

## 2016-07-15 LAB — COMPREHENSIVE METABOLIC PANEL
ALT: 13 U/L — ABNORMAL LOW (ref 17–63)
AST: 21 U/L (ref 15–41)
Albumin: 4.3 g/dL (ref 3.5–5.0)
Alkaline Phosphatase: 42 U/L (ref 38–126)
Anion gap: 6 (ref 5–15)
BUN: 14 mg/dL (ref 6–20)
CHLORIDE: 104 mmol/L (ref 101–111)
CO2: 29 mmol/L (ref 22–32)
Calcium: 8.9 mg/dL (ref 8.9–10.3)
Creatinine, Ser: 1.02 mg/dL (ref 0.61–1.24)
GFR calc Af Amer: >60 mL/min (ref >60)
Glucose, Bld: 75 mg/dL (ref 65–99)
POTASSIUM: 3.9 mmol/L (ref 3.5–5.1)
Sodium: 139 mmol/L (ref 135–145)
Total Bilirubin: 0.8 mg/dL (ref 0.3–1.2)
Total Protein: 7 g/dL (ref 6.5–8.1)

## 2016-07-15 LAB — LIPID PANEL
CHOL/HDL RATIO: 3.2 ratio
CHOLESTEROL: 132 mg/dL (ref 0–200)
HDL: 41 mg/dL (ref >40)
LDL Cholesterol: 82 mg/dL (ref 0–99)
Triglycerides: 46 mg/dL (ref <150)
VLDL: 9 mg/dL (ref 0–40)

## 2016-07-15 MED ORDER — ENSURE ENLIVE PO LIQD
237.0000 mL | Freq: Three times a day (TID) | ORAL | Status: DC
  Administered 2016-07-15: 237 mL via ORAL

## 2016-07-15 NOTE — Progress Notes (Signed)
D    Pt mostly isolated to his room    He did attend group but did not require sleep medications    He continues to endorse anxiety and depression A    Verbal support given    Medications administered and effectiveness monitored    Q 15 min checks R   Pt is safe at present

## 2016-07-15 NOTE — Progress Notes (Signed)
NUTRITION ASSESSMENT  Pt identified as at risk on the Malnutrition Screen Tool  INTERVENTION: 1. Educated patient on the importance of nutrition and encouraged intake of food and beverages. 2. Discussed weight goals. 3. Supplements: continue Ensure Enlive but will increase to TID, each supplement provides 350 kcal and 20 grams of protein  NUTRITION DIAGNOSIS: Unintentional weight loss related to sub-optimal intake as evidenced by pt report.   Goal: Pt to meet >/= 90% of their estimated nutrition needs.  Monitor:  PO intake  Assessment:  Pt admitted for worsening depression and anxiety with some feelings of SI. Pt reported malaise/fatigue and weight loss d/t persistent N/V PTA. Per review, pt has lost 10 lbs (7% body weight) in the past ~5 months which is not significant for time frame. Will increase Ensure Enlive from BID to TID.  26 y.o. male  Height: Ht Readings from Last 1 Encounters:  07/14/16 6\' 3"  (1.905 m)    Weight: Wt Readings from Last 1 Encounters:  07/14/16 129 lb (58.5 kg)    Weight Hx: Wt Readings from Last 10 Encounters:  07/14/16 129 lb (58.5 kg)  06/30/16 129 lb (58.5 kg)  03/25/16 128 lb (58.1 kg)  03/21/16 133 lb (60.3 kg)  03/06/16 135 lb (61.2 kg)  02/22/16 139 lb (63 kg)  02/18/16 139 lb (63 kg)  02/18/16 139 lb (63 kg)  02/17/16 139 lb (63 kg)  01/28/16 137 lb (62.1 kg)    BMI:  Body mass index is 16.12 kg/m. Pt meets criteria for underweight based on current BMI.  Estimated Nutritional Needs: Kcal: 25-30 kcal/kg Protein: > 1 gram protein/kg Fluid: 1 ml/kcal  Diet Order: Diet regular Room service appropriate? Yes; Fluid consistency: Thin Pt is also offered choice of unit snacks mid-morning and mid-afternoon.  Pt is eating as desired.   Lab results and medications reviewed.     Trenton GammonJessica Kaoru Benda, MS, RD, LDN, [facility] Inpatient Clinical Dietitian Pager # [phone] After hours/weekend pager # [phone]

## 2016-07-15 NOTE — [facility] Group Notes (Signed)
[facility] LCSW Group Therapy 07/15/2016 1:15pm  Type of Therapy: Group Therapy- Feelings Around Relapse and Recovery  Pt did not attend, declined invitation.   Vernie ShanksLauren Lilla Callejo, LCSW [phone] 07/15/2016 5:21 PM
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
## 2016-07-15 NOTE — Progress Notes (Signed)
Adult Psychoeducational Group Note  Date:  07/15/2016 Time:  8:52 PM  Group Topic/Focus:  Wrap-Up Group:   The focus of this group is to help patients review their daily goal of treatment and discuss progress on daily workbooks.  Participation Level:  Active  Participation Quality:  Appropriate  Affect:  Appropriate  Cognitive:  Alert  Insight: Appropriate  Engagement in Group:  Engaged  Modes of Intervention:  Discussion  Additional Comments:  Pt stated that he is feeling confused and has no direction. He is having a hard time adapting to the inpatient environment. His goal is to reduce his anxiety and learn how to deal with his depression.  Kaleen OdeaCOOKE, Kelcee Bjorn R 07/15/2016, 8:52 PM

## 2016-07-15 NOTE — [facility] Suicide Risk Assessment (Signed)
[facility] INPATIENT:  Family/Significant Other Suicide Prevention Education  Suicide Prevention Education:  Education Completed; Samuel Townsend mother [phone],  (name of family member/significant other) has been identified by the patient as the family member/significant other with whom the patient will be residing, and identified as the person(s) who will aid the patient in the event of a mental health crisis (suicidal ideations/suicide attempt).  With written consent from the patient, the family member/significant other has been provided the following suicide prevention education, prior to the and/or following the discharge of the patient.  The suicide prevention education provided includes the following:  Suicide risk factors  Suicide prevention and interventions  National Suicide Hotline telephone number  [facility] assessment telephone number  [facility] Assistance 911  [facility] and/or Residential Mobile Crisis Unit telephone number  Request made of family/significant other to:  Remove weapons (e.g., guns, rifles, knives), all items previously/currently identified as safety concern.    Remove drugs/medications (over-the-counter, prescriptions, illicit drugs), all items previously/currently identified as a safety concern.  The family member/significant other verbalizes understanding of the suicide prevention education information provided.  The family member/significant other agrees to remove the items of safety concern listed above.  Mother confirms that pt will live w her at discharge.  No access to weapons or unsecured medications.  Concerned that patient's GI medications may be interacting and creating depressive symptoms.  Wants medications reviewed.  Supportive of patient, has visited regularly.  Aware of need to monitor patient's thought processes for ongoing suicidal ideation after discharge, informed of aftercare appointments.    Samuel Townsend  Samuel Townsend 07/15/2016, 5:05 PM
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
## 2016-07-15 NOTE — Progress Notes (Signed)
Samuel Townsend is irritable, blunt and does not want to interact. He has spent much of his time in his bed. A HE did complete his daily assessment and on it he wrote he deneid SI today and he rated his depression, hopelessness and anxiety " 5/0/8", respectively. R Safety in place. Staff to cont to establish therpaeutic rapport with pt.

## 2016-07-15 NOTE — Tx Team (Signed)
Interdisciplinary Treatment and Diagnostic Plan Update  07/15/2016 Time of Session: 5:22 PM  TIP ATIENZA MRN: [id number]  Principal Diagnosis: Major depressive disorder, recurrent episode with anxious distress ([place])  Secondary Diagnoses: Principal Problem:   Major depressive disorder, recurrent episode with anxious distress ([place]) Active Problems:   Posttraumatic stress disorder   Current Medications:  Current Facility-Administered Medications  Medication Dose Route Frequency Provider Last Rate Last Dose  . acetaminophen (TYLENOL) tablet 650 mg  650 mg Oral Q6H PRN Rozetta Nunnery, NP      . alum & mag hydroxide-simeth (MAALOX/MYLANTA) 200-200-20 MG/5ML suspension 30 mL  30 mL Oral Q4H PRN Rozetta Nunnery, NP      . citalopram (CELEXA) tablet 20 mg  20 mg Oral Daily Rozetta Nunnery, NP   20 mg at 07/15/16 0833  . cyproheptadine (PERIACTIN) 4 MG tablet 8 mg  8 mg Oral TID PRN Rozetta Nunnery, NP      . feeding supplement (ENSURE ENLIVE) (ENSURE ENLIVE) liquid 237 mL  237 mL Oral TID BM Jenne Campus, MD   237 mL at 07/15/16 0923  . LORazepam (ATIVAN) tablet 1 mg  1 mg Oral Q4H PRN Rozetta Nunnery, NP      . magnesium hydroxide (MILK OF MAGNESIA) suspension 30 mL  30 mL Oral Daily PRN Rozetta Nunnery, NP      . prochlorperazine (COMPAZINE) tablet 10 mg  10 mg Oral Q6H PRN Rozetta Nunnery, NP      . traZODone (DESYREL) tablet 50 mg  50 mg Oral QHS,MR X 1 Rozetta Nunnery, NP        PTA Medications: Prescriptions Prior to Admission  Medication Sig Dispense Refill Last Dose  . citalopram (CELEXA) 20 MG tablet Take 1 tablet (20 mg total) by mouth daily. 30 tablet 2 07/14/2016 at Unknown time  . cyproheptadine (PERIACTIN) 4 MG tablet Take 2 tablets (8 mg total) by mouth 3 (three) times daily as needed for allergies (nausea, loss of appetite). (Patient taking differently: Take 4 mg by mouth 3 (three) times daily as needed (nausea, loss of appetite). ) 30 tablet 2 Past Week at Unknown time  .  HYDROcodone-acetaminophen (NORCO/VICODIN) 5-325 MG tablet Take 1 tablet by mouth every 6 (six) hours as needed for moderate pain (only use during an abdominal migraine/cyclic vomiting episode). 30 tablet 0 more than a month  . LORazepam (ATIVAN) 1 MG tablet Take 1 tablet (1 mg total) by mouth every 4 (four) hours as needed (only during abdominal migraine/cyclic vomiting episode). (Patient taking differently: Take 1 mg by mouth every 4 (four) hours as needed for anxiety (only during abdominal migraine/cyclic vomiting episode). ) 30 tablet 0 Past Week at Unknown time  . prochlorperazine (COMPAZINE) 10 MG tablet Take 1 tablet (10 mg total) by mouth every 6 (six) hours as needed for nausea, vomiting or refractory nausea / vomiting (only use during an episode). 40 tablet 2 Past Week at Unknown time  . docusate sodium (COLACE) 100 MG capsule Take 1 capsule (100 mg total) by mouth 2 (two) times daily. (Patient not taking: Reported on 07/15/2016) 10 capsule 0 Not Taking at Unknown time  . omeprazole (PRILOSEC) 20 MG capsule Take 1 capsule (20 mg total) by mouth daily. (Patient not taking: Reported on 07/15/2016) 21 capsule 0 Not Taking at Unknown time  . sucralfate (CARAFATE) 1 g tablet Take 1 tablet (1 g total) by mouth 3 (three) times daily with meals. Dissolve tablet in water  and drink. (Patient not taking: Reported on 07/15/2016) 120 tablet 1 Not Taking at Unknown time    Treatment Modalities: Medication Management, Group therapy, Case management,  1 to 1 session with clinician, Psychoeducation, Recreational therapy.  Patient Stressors: Marital or family conflict Medication change or noncompliance  Patient Strengths: Average or above average intelligence Capable of independent living General fund of knowledge Supportive family/friends  Physician Treatment Plan for Primary Diagnosis: Major depressive disorder, recurrent episode with anxious distress ([place]) Long Term Goal(s): Improvement in symptoms so as  ready for discharge  Short Term Goals: Ability to identify changes in lifestyle to reduce recurrence of condition will improve Ability to verbalize feelings will improve Ability to disclose and discuss suicidal ideas Ability to demonstrate self-control will improve Ability to identify and develop effective coping behaviors will improve Ability to maintain clinical measurements within normal limits will improve Compliance with prescribed medications will improve Ability to identify triggers associated with substance abuse/mental health issues will improve Ability to identify changes in lifestyle to reduce recurrence of condition will improve Ability to verbalize feelings will improve Ability to disclose and discuss suicidal ideas Ability to demonstrate self-control will improve Ability to identify and develop effective coping behaviors will improve Ability to maintain clinical measurements within normal limits will improve Compliance with prescribed medications will improve Ability to identify triggers associated with substance abuse/mental health issues will improve  Medication Management: Evaluate patient's response, side effects, and tolerance of medication regimen.  Therapeutic Interventions: 1 to 1 sessions, Unit Group sessions and Medication administration.  Evaluation of Outcomes: Not Met  Physician Treatment Plan for Secondary Diagnosis: Principal Problem:   Major depressive disorder, recurrent episode with anxious distress ([place]) Active Problems:   Posttraumatic stress disorder   Long Term Goal(s): Improvement in symptoms so as ready for discharge  Short Term Goals: Ability to identify changes in lifestyle to reduce recurrence of condition will improve Ability to verbalize feelings will improve Ability to disclose and discuss suicidal ideas Ability to demonstrate self-control will improve Ability to identify and develop effective coping behaviors will improve Ability to  maintain clinical measurements within normal limits will improve Compliance with prescribed medications will improve Ability to identify triggers associated with substance abuse/mental health issues will improve Ability to identify changes in lifestyle to reduce recurrence of condition will improve Ability to verbalize feelings will improve Ability to disclose and discuss suicidal ideas Ability to demonstrate self-control will improve Ability to identify and develop effective coping behaviors will improve Ability to maintain clinical measurements within normal limits will improve Compliance with prescribed medications will improve Ability to identify triggers associated with substance abuse/mental health issues will improve  Medication Management: Evaluate patient's response, side effects, and tolerance of medication regimen.  Therapeutic Interventions: 1 to 1 sessions, Unit Group sessions and Medication administration.  Evaluation of Outcomes: Not Met   RN Treatment Plan for Primary Diagnosis: Major depressive disorder, recurrent episode with anxious distress ([place]) Long Term Goal(s): Knowledge of disease and therapeutic regimen to maintain health will improve  Short Term Goals: Ability to verbalize feelings will improve, Ability to disclose and discuss suicidal ideas and Ability to identify and develop effective coping behaviors will improve  Medication Management: RN will administer medications as ordered by provider, will assess and evaluate patient's response and provide education to patient for prescribed medication. RN will report any adverse and/or side effects to prescribing provider.  Therapeutic Interventions: 1 on 1 counseling sessions, Psychoeducation, Medication administration, Evaluate responses to treatment, Monitor vital signs  and CBGs as ordered, Perform/monitor CIWA, COWS, AIMS and Fall Risk screenings as ordered, Perform wound care treatments as ordered.  Evaluation of  Outcomes: Not Met   LCSW Treatment Plan for Primary Diagnosis: Major depressive disorder, recurrent episode with anxious distress ([place]) Long Term Goal(s): Safe transition to appropriate next level of care at discharge, Engage patient in therapeutic group addressing interpersonal concerns.  Short Term Goals: Engage patient in aftercare planning with referrals and resources, Identify triggers associated with mental health/substance abuse issues and Increase skills for wellness and recovery  Therapeutic Interventions: Assess for all discharge needs, 1 to 1 time with Social worker, Explore available resources and support systems, Assess for adequacy in community support network, Educate family and significant other(s) on suicide prevention, Complete Psychosocial Assessment, Interpersonal group therapy.  Evaluation of Outcomes: Not Met   Progress in Treatment: Attending groups: Pt is new to milieu, continuing to assess  Participating in groups: Pt is new to milieu, continuing to assess  Taking medication as prescribed: Yes, MD continues to assess for medication changes as needed Toleration medication: Yes, no side effects reported at this time Family/Significant other contact made: Yes with mother Patient understands diagnosis: Continuing to assess Discussing patient identified problems/goals with staff: Yes Medical problems stabilized or resolved: Yes Denies suicidal/homicidal ideation: Yes Issues/concerns per patient self-inventory: None Other: N/A  New problem(s) identified: None identified at this time.   New Short Term/Long Term Goal(s): None identified at this time.   Discharge Plan or Barriers: Pt will return home and follow-up with outpatient services.   Reason for Continuation of Hospitalization: Anxiety Depression Medication stabilization Suicidal Ideation  Estimated Length of Stay: 2-4 days  Attendees: Patient: 07/15/2016  5:22 PM  Physician: Dr. Shea Evans; Dr. Merton Border  07/15/2016  5:22 PM  Nursing: Sandre Kitty, Darrol Angel, RN 07/15/2016  5:22 PM  RN Care Manager: Lars Pinks, RN 07/15/2016  5:22 PM  Social Worker: Adriana Reams, LCSW; Max, LCSW 07/15/2016  5:22 PM  Recreational Therapist:  07/15/2016  5:22 PM  Other: Lindell Spar, NP; Samuel Jester, NP 07/15/2016  5:22 PM  Other:  07/15/2016  5:22 PM  Other: 07/15/2016  5:22 PM    Scribe for Treatment Team: Gladstone Lighter, LCSW 07/15/2016 5:22 PM

## 2016-07-16 LAB — HEMOGLOBIN A1C
HEMOGLOBIN A1C: 4.7 % — AB (ref 4.8–5.6)
Mean Plasma Glucose: 88 mg/dL
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
## 2016-07-16 NOTE — Progress Notes (Addendum)
D    Pt has been interacting with his peers and has been appropriate     He endorses depression and anxiety but has improved    He denies suicidal ideation presently  Pt refused his sleep medications and the Ensure A    Verbal support given   Medications administered and effectiveness monitored    Q 15 min checks R   Pt is presently safe

## 2016-07-16 NOTE — Progress Notes (Signed)
Adult Psychoeducational Group Note  Date:  07/16/2016 Time:  0915  Group Topic/Focus:  Coping With Mental Health Crisis:   The purpose of this group is to help patients identify strategies for coping with mental health crisis.  Group discusses possible causes of crisis and ways to manage them effectively.  Participation Level:  Did Not Attend  Participation Quality:    Affect:    Cognitive:    Insight:   Engagement in Group:    Modes of Intervention:    Additional Comments:    Kymia Simi L 07/16/2016, 2:40 PM

## 2016-07-16 NOTE — Plan of Care (Signed)
Problem: Activity: Goal: Imbalance in normal sleep/wake cycle will improve Outcome: Progressing Pt sleep has improved and he is sleeping atleast 6 hours a night  Problem: Safety: Goal: Ability to identify and utilize support systems that promote safety will improve Outcome: Progressing Pt can identify his mother as a support person  Problem: Self-Concept: Goal: Level of anxiety will decrease Outcome: Progressing Pt anxiety level is decreasing

## 2016-07-16 NOTE — Plan of Care (Signed)
Problem: Education: Goal: Knowledge of the prescribed therapeutic regimen will improve Outcome: Progressing Patient taking medications as ordered with minimal reminders.

## 2016-07-16 NOTE — [facility] Group Notes (Signed)
Adult Therapy Group Note  Date:  07/16/2016 Time: 2:15-3:05pm  Group Topic/Focus:  Discussion of healthy versus unhealthy coping techniques, and what specific healthy coping techniques patients would like to learn.  Examples given, reasons discussed, and healthy group conversation was held.  Healthy coping skills desired include:  Not holding in feelings to the point of exploding, meditation, learning how to handle social situations, positive self-talk replacing the negative, communication skills, talking about things, not letting small things build up and bother them.  Participation Level:  Active  Participation Quality:  Attentive and Sharing  Affect:  Appropriate  Cognitive:  Appropriate  Insight: Good  Engagement in Group:  Engaged  Modes of Intervention:  Discussion and Exploration  Additional Comments:  Pt stated he needs to learn how to "put more energy into the positive in my life instead of the negative."  He agreed that this related to self-talk.  He was an active member of the discussion about things people tend to say to themselves, and how he can start to address this habit in himself.  Carloyn JaegerMareida J Grossman-Orr 07/16/2016, 4:30 PM

## 2016-07-16 NOTE — Progress Notes (Signed)
Data. Patient denies SI/HI/AVH. Patient interacting well with staff and other patients. Affect has been alternately flat and bright. Brightens with interaction with peers, especially females. Presents guarded with staff. On his self assessment patient reports 0/10 for depression and hopelessness and 3/10 for anxiety. His goal for today is: "self love." Action. Emotional support and encouragement offered. Education provided on medication, indications and side effect. Q 15 minute checks done for safety. Response. Safety on the unit maintained through 15 minute checks.  Medications taken as prescribed. Attended groups. Remained calm and appropriate through out shift.

## 2016-07-17 MED ORDER — CITALOPRAM HYDROBROMIDE 20 MG PO TABS: 20.0000 mg | ORAL_TABLET | Freq: Every day | ORAL | 0 refills | Status: DC

## 2016-07-17 MED ORDER — TRAZODONE HCL 50 MG PO TABS: 50.0000 mg | ORAL_TABLET | Freq: Every evening | ORAL | 0 refills | Status: DC | PRN

## 2016-07-17 NOTE — BHH Group Notes (Signed)
B[facility] Group Notes: (Clinical Social Work)   07/17/2016      Type of Therapy:  Group Therapy   Participation Level:  Did Not Attend - had been discharged    Ambrose MantleMareida Grossman-Orr, LCSW 07/17/2016, 6:03 PM

## 2016-07-17 NOTE — BHH Suicide Risk Assessment (Signed)
[facility] Discharge Suicide Risk Assessment   Principal Problem: Major depressive disorder, recurrent episode with anxious distress [facility]) Discharge Diagnoses:  Patient Active Problem List   Diagnosis Date Noted  . Major depressive disorder, recurrent episode with anxious distress (HCC) [F33.9] 07/15/2016  . Posttraumatic stress disorder [F43.10] 07/01/2016  . Anxiety disorder, NOS [F41.9] 03/22/2016  . 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 Underweight [R63.6] 11/18/2014    Total Time spent with patient: 20 minutes  Musculoskeletal: Strength & Muscle Tone: within normal limits Gait & Station: normal Patient leans: N/A  Psychiatric Specialty Exam: Review of Systems  Psychiatric/Behavioral: Positive for depression. Negative for hallucinations, substance abuse and suicidal ideas. The patient is nervous/anxious. The patient does not have insomnia.   All other systems reviewed and are negative.   Blood pressure 120/72, pulse 87, temperature 97.9 F (36.6 C), temperature source Oral, resp. rate 16, height 6\' 3"  (1.905 m), weight 129 lb (58.5 kg), SpO2 100 %.Body mass index is 16.12 kg/m.  General Appearance: Casual  Eye Contact::  Good  Speech:  Clear and Coherent409  Volume:  Normal  Mood:  "good"  Affect:  slightly restricted  Thought Process:  Coherent and Goal Directed  Orientation:  Full (Time, Place, and Person)  Thought Content:  Logical Perceptions: denies AH/VH  Suicidal Thoughts:  No  Homicidal Thoughts:  No  Memory:  Immediate;   Good Recent;   Good Remote;   Good  Judgement:  Fair  Insight:  Fair  Psychomotor Activity:  Normal  Concentration:  Good  Recall:  Good  Fund of Knowledge:Good  Language: Good  Akathisia:  No  Handed:  Right  AIMS (if indicated):     Assets:  Communication Skills Desire for Improvement  Sleep:  Number of Hours: 6.75  Cognition: WNL  ADL's:  Intact   Mental Status Per Nursing Assessment::   On  Admission:     Demographic Factors:  Male and Cardell Peach, lesbian, or bisexual orientation  Loss Factors: Loss of significant relationship  Historical Factors: NA  Risk Reduction Factors:   Employed and Positive social support  Continued Clinical Symptoms:  Depression:   Hopelessness  Cognitive Features That Contribute To Risk:  None    Suicide Risk:  Mild:  Suicidal ideation of limited frequency, intensity, duration, and specificity.  There are no identifiable plans, no associated intent, mild dysphoria and related symptoms, good self-control (both objective and subjective assessment), few other risk factors, and identifiable protective factors, including available and accessible social support.  Follow-up Information    [facility] Follow up.   Why:  Medications management appointment requested w this provider.   Contact information: [address] [place]  [address] Phone:  [phone] Fax:  On EPIC       [facility] Follow up.   Specialty:  Behavioral Health Why:  Initial appointment for therapy with Dr. Laymond Purser at 5:30 PM on Tuesday 08/02/16.  Please call to cancel/reschedule if needed. Must give 24 hours notice for cancellations.  Arrive 15 minutes early for first appointment.  Bring hospital discharge paperwork.   Contact information: [address] [place] [address] [phone]       [facility] Follow up on 07/21/2016.   Why:  Patient current with EACP counselor Rickey Barbara.  Next appointment is 1/25at 4 PM.   Contact information: [facility] [address], [address] [place], [place] [address] Phone:  [phone]  Patient reports break up with his ex-boyfriend which led to worsening depression. He denies SI and believes that he can use coping skills of going for a drive, talking with his mother or seeing his puppy. His mother will bring him to her house so that  he does not need to see his ex-boyfriend in the apartment. He is future oriented and agrees to continue current medication/[facility].   Plan Of Care/Follow-up recommendations:  Activity:  regular Diet:  regular Tests:  n/a Other:  n/a  Neysa Hottereina Leeandra Ellerson, MD 07/17/2016, 9:51 AM

## 2016-07-17 NOTE — BHH Group Notes (Signed)
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  Cranford MonBeaudry, Reeshemah Nazaryan Evans 07/17/2016, 11:29 AM

## 2016-07-17 NOTE — Progress Notes (Signed)
Data. Patient denies SI/HI/AVH. Patient interacting well with staff and other patients. Affect continues to be bright. Patient reports, "I'm looking forward to leaving today." On his self assessment patient reports, 0/10 for depression and hopelessness and 2/10 for anxiety. His goal for today is: "Getting ready to go home." Action. Emotional support and encouragement offered. Education provided on medication, indications and side effect. Q 15 minute checks done for safety. Response. Safety on the unit maintained through 15 minute checks.  Medications taken as prescribed. Attended groups. Remained calm and appropriate through out shift.  Pt. discharged to lobby.  Belongings sheet reviewed and signed by pt. and all belongings, including scripts,  sent home. Paperwork reviewed and pt. able to verbalize understanding of education. Pt. in no current distress and ambulatory.
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
## 2016-07-17 NOTE — Progress Notes (Signed)
  [facility] Dis[facility] :  Will you be returning to the same living situation after discharge:  Yes,  back home, with former partner moving out in the next few days.  Will also be back and forth to mother's house At discharge, do you have transportation home?: Yes,  mother Do you have the ability to pay for your medications: Yes,  income and insurance  Release of information consent forms completed and in the chart;  Patient's signature needed at discharge.  Patient to Follow up at: Follow-up Information    [facility] Follow up.   Why:  Medications management appointment requested w this provider.   Contact information: [address] [place]  [address] Phone:  [phone] Fax:  On EPIC       [facility].   Specialty:  Behavioral Health Why:  Initial appointment for therapy with Dr. Laymond PurserPerrin at 5:30 PM on Tuesday 08/02/16.  Please call to cancel/reschedule if needed. Must give 24 hours notice for cancellations.  Arrive 15 minutes early for first appointment.  Bring hospital discharge paperwork.   Contact information: [address] [place] [id number] [phone]       [facility] Follow up on 07/21/2016.   Why:  Patient current with EACP counselor Rickey BarbaraBrandon Williams.  Next appointment is 1/25at 4 PM.   Contact information: [facility] [address] [place], [place] [address] Phone:  [phone]            Next level of care provider has access to [facility] Link:no  Safety Planning and Suicide Prevention discussed: Yes,  with patient during discharge assessment and with his mother by phone  Have you used any form of tobacco in the last 30 days? (Cigarettes, Smokeless Tobacco, Cigars, and/or Pipes): No  Has patient been referred to the Quitline?: N/A patient is not a smoker  Patient has been referred for addiction treatment: N/A  Lynnell ChadMareida J  Grossman-Orr 07/17/2016, 9:32 AM

## 2016-07-18 NOTE — BHH Counselor (Signed)
Patient scheduled for medications management w Dr Lolly MustacheArfeen on 2/19 at 11 AM.  Patient notified,  Santa GeneraAnne Calil Amor, LCSW Lead Clinical Social Worker Phone:  [phone]

## 2016-07-22 ENCOUNTER — Encounter: Payer: Self-pay | Admitting: Family Medicine

## 2016-07-22 NOTE — Progress Notes (Signed)
[facility], Beverly GustKenneth Koch, MD Evaluation of patient's chronic recurrent GI symptoms.  Assessment - Likely Multifactorial with contributions from his anxiety and GERD - His presentation warrants further efvaluation for possible small intestin bacterial overgrowth, POTS and adrenal insufficiency.  Plan - Hydrogen Breath test - Tilt Table Test - Cortisol level - Protonix BID - RTC 2 months.

## 2016-07-30 ENCOUNTER — Ambulatory Visit (HOSPITAL_COMMUNITY)
Admission: EM | Admit: 2016-07-30 | Discharge: 2016-07-30 | Disposition: A | Discharge status: Home / Self Care | Payer: 59 | Attending: Family Medicine | Admitting: Family Medicine

## 2016-07-30 ENCOUNTER — Encounter (HOSPITAL_COMMUNITY): Payer: Self-pay | Admitting: Family Medicine

## 2016-07-30 DIAGNOSIS — Z202 Contact with and (suspected) exposure to infections with a predominantly sexual mode of transmission: Secondary | ICD-10-CM | POA: Insufficient documentation

## 2016-07-30 DIAGNOSIS — Z79899 Other long term (current) drug therapy: Secondary | ICD-10-CM | POA: Diagnosis not present

## 2016-07-30 DIAGNOSIS — R369 Urethral discharge, unspecified: Secondary | ICD-10-CM | POA: Diagnosis not present

## 2016-07-30 DIAGNOSIS — R202 Paresthesia of skin: Secondary | ICD-10-CM | POA: Diagnosis not present

## 2016-07-30 LAB — POCT URINALYSIS DIP (DEVICE)
Bilirubin Urine: NEGATIVE
GLUCOSE, UA: NEGATIVE mg/dL
Hgb urine dipstick: NEGATIVE
KETONES UR: NEGATIVE mg/dL
Nitrite: NEGATIVE
Protein, ur: NEGATIVE mg/dL
SPECIFIC GRAVITY, URINE: 1.015 (ref 1.005–1.030)
Urobilinogen, UA: 1 mg/dL (ref 0.0–1.0)
pH: 7.5 (ref 5.0–8.0)
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
## 2016-07-30 NOTE — ED Triage Notes (Signed)
Pt here for STD check. sts discharge and burning.

## 2016-08-01 DIAGNOSIS — K909 Intestinal malabsorption, unspecified: Secondary | ICD-10-CM | POA: Diagnosis not present

## 2016-08-01 DIAGNOSIS — R197 Diarrhea, unspecified: Secondary | ICD-10-CM | POA: Diagnosis not present

## 2016-08-01 DIAGNOSIS — A049 Bacterial intestinal infection, unspecified: Secondary | ICD-10-CM | POA: Diagnosis not present

## 2016-08-01 DIAGNOSIS — R634 Abnormal weight loss: Secondary | ICD-10-CM | POA: Diagnosis not present

## 2016-08-01 DIAGNOSIS — R143 Flatulence: Secondary | ICD-10-CM | POA: Diagnosis not present

## 2016-08-01 DIAGNOSIS — K5900 Constipation, unspecified: Secondary | ICD-10-CM | POA: Diagnosis not present

## 2016-08-01 LAB — URINE CYTOLOGY ANCILLARY ONLY
Chlamydia: POSITIVE — AB
NEISSERIA GONORRHEA: NEGATIVE
Trichomonas: NEGATIVE

## 2016-08-01 LAB — URINE CULTURE

## 2016-08-02 ENCOUNTER — Telehealth (HOSPITAL_COMMUNITY): Payer: Self-pay | Admitting: Internal Medicine

## 2016-08-02 ENCOUNTER — Ambulatory Visit (INDEPENDENT_AMBULATORY_CARE_PROVIDER_SITE_OTHER): Payer: 59 | Admitting: Psychology

## 2016-08-02 DIAGNOSIS — F411 Generalized anxiety disorder: Secondary | ICD-10-CM | POA: Diagnosis not present

## 2016-08-02 MED ORDER — AZITHROMYCIN 500 MG PO TABS: 1000.0000 mg | ORAL_TABLET | Freq: Once | ORAL | 0 refills | Status: DC

## 2016-08-02 NOTE — Telephone Encounter (Signed)
Clinical staff, please let patient and health department know that test for chlamydia was positive.  Rx zithromax sent to the pharmacy of [facility] on [address]  Sexual partners should be notified and tested/treated.  Recheck or followup with PCP for further evaluation if symptoms are not improving.  LM

## 2016-08-03 MED FILL — AZITHROMYCIN 500 MG TABLET: 500 | 1 days supply | Qty: 2 | Fill #0

## 2016-08-08 MED FILL — CITALOPRAM HBR 20 MG TABLET: 20 | 30 days supply | Qty: 30 | Fill #1

## 2016-08-09 ENCOUNTER — Ambulatory Visit (INDEPENDENT_AMBULATORY_CARE_PROVIDER_SITE_OTHER): Payer: 59 | Admitting: Psychology

## 2016-08-09 ENCOUNTER — Telehealth: Payer: Self-pay | Admitting: Family Medicine

## 2016-08-09 DIAGNOSIS — F411 Generalized anxiety disorder: Secondary | ICD-10-CM | POA: Diagnosis not present

## 2016-08-09 NOTE — Telephone Encounter (Signed)
Valor did take the Azithromycin with improvement of his urethral symptoms.  Mild sysmptoms persist. He understood no sexual relations for a week after dose.  He is to come to clinic if his symptoms do not resolve in next few days or if they worsen indicating a possible additional GU infection, e.g., trichomatosis, or reinfection with Chlamydia.

## 2016-08-12 NOTE — Telephone Encounter (Signed)
Samuel Townsend is still having penile discharge 2 weeks after dose of Azithromycin for chlamydia urethritis. He did not get empiric treatment for GC nor treatment for possible concurrent trichomonas urethritis.  I have asked in to come in next week for a reassessment for concurrent GC or trich infection, or a reinfection with chlamydia.

## 2016-08-12 NOTE — Telephone Encounter (Signed)
Patient scheduled for Monday. Jazmin Hartsell,CMA

## 2016-08-12 NOTE — Telephone Encounter (Signed)
Please contact Mr Samuel Townsend to set up a SDA for this Monday or Tuesday with SDA provider for penile discharge.

## 2016-08-15 ENCOUNTER — Ambulatory Visit (HOSPITAL_COMMUNITY): Payer: Self-pay | Admitting: Psychiatry

## 2016-08-15 ENCOUNTER — Ambulatory Visit: Payer: 59 | Admitting: Family Medicine

## 2016-08-17 ENCOUNTER — Ambulatory Visit: Payer: Self-pay | Admitting: Family Medicine

## 2016-08-18 ENCOUNTER — Other Ambulatory Visit (HOSPITAL_COMMUNITY)
Admission: RE | Admit: 2016-08-18 | Discharge: 2016-08-18 | Disposition: A | Discharge status: Home / Self Care | Payer: 59 | Source: Ambulatory Visit | Attending: Family Medicine | Admitting: Family Medicine

## 2016-08-18 ENCOUNTER — Encounter: Payer: Self-pay | Admitting: Family Medicine

## 2016-08-18 ENCOUNTER — Ambulatory Visit (INDEPENDENT_AMBULATORY_CARE_PROVIDER_SITE_OTHER): Payer: 59 | Admitting: Family Medicine

## 2016-08-18 VITALS — BP 104/60 | HR 86 | Temp 98.4°F | Ht 75.0 in | Wt 129.6 lb

## 2016-08-18 DIAGNOSIS — Z113 Encounter for screening for infections with a predominantly sexual mode of transmission: Secondary | ICD-10-CM | POA: Insufficient documentation

## 2016-08-18 DIAGNOSIS — Z202 Contact with and (suspected) exposure to infections with a predominantly sexual mode of transmission: Secondary | ICD-10-CM

## 2016-08-18 MED ORDER — DOXYCYCLINE HYCLATE 100 MG PO TABS: 100.0000 mg | ORAL_TABLET | Freq: Two times a day (BID) | ORAL | 0 refills | Status: DC

## 2016-08-18 MED FILL — DOXYCYCLINE HYCLATE 100 MG: 100 | 7 days supply | Qty: 14 | Fill #0

## 2016-08-18 NOTE — Patient Instructions (Signed)
Thank you so much for coming to visit today! I have sent a prescription for Doxycycline to the pharmacy to take twice daily for 7 days. Several labs were obtained and if any other treatments are indicated we will contact you. Please return if symptoms do not improve with antibiotics.  Dr. Caroleen Hammanumley

## 2016-08-21 NOTE — Progress Notes (Signed)
Subjective:     Patient ID: Samuel Townsend, male   DOB: 15-Sep-1990, 26 y.o.   MRN: [id number]  HPI Mr. Samuel Townsend is a 25yo male presenting today for penile discharge. Also noted penile discharge on 07/30/16 and tested positive for Chlamydia; treated with Azithromycin. Since treatment, has noted no change in penile discharge. Also notes tingling and "feels like something is moving inside my penis." Notes burning after urination. Denies changes in urinary frequency or urgency. Denies fever. Denies rash or skin lesions. While discharge improved slightly, it has never resolved. Discharge is worse in the morning. Has not been sexually active since diagnosis of Chlamydia. About to leave to go to trip to [place] for some time. Requests repeat treatment for Chlamydia. Never Smoker.  Review of Systems Per HPI    Objective:   Physical Exam  Constitutional: He appears well-developed and well-nourished. No distress.  HENT:  Head: Normocephalic and atraumatic.  Cardiovascular: Normal rate and regular rhythm.   No murmur heard. Pulmonary/Chest: Effort normal. No respiratory distress. He has no wheezes.  Abdominal: Soft. He exhibits no distension. There is no tenderness.  Genitourinary: Penis normal.  Skin: No rash noted.  Psychiatric: He has a normal mood and affect. His behavior is normal.       Assessment and Plan:     1. STD exposure Previous positive Chlamydia screen. Will repeat testing for Gonorrhea, Chlamydia, and Trichomonas today. Does not wish to wait for results to receive another round of Chlamydia treatment since he is leaving for [place]. Prescription for Doxycycline (completed Azithromycin) given. If tests positive for Gonorrhea or Trichomonas, will treat for those as well. - Urine cytology ancillary only

## 2016-08-22 LAB — URINE CYTOLOGY ANCILLARY ONLY
Chlamydia: NEGATIVE
Neisseria Gonorrhea: NEGATIVE
Trichomonas: NEGATIVE

## 2016-08-23 ENCOUNTER — Ambulatory Visit: Payer: 59 | Admitting: Psychology

## 2016-08-24 ENCOUNTER — Ambulatory Visit (HOSPITAL_COMMUNITY): Payer: Self-pay | Admitting: Psychiatry

## 2016-08-26 ENCOUNTER — Telehealth: Payer: Self-pay | Admitting: Family Medicine

## 2016-08-26 NOTE — Telephone Encounter (Signed)
Contacted concerning lab results. Reports resolution of symptoms after completion of antibiotics.

## 2016-09-06 ENCOUNTER — Ambulatory Visit (INDEPENDENT_AMBULATORY_CARE_PROVIDER_SITE_OTHER): Payer: 59 | Admitting: Psychology

## 2016-09-06 DIAGNOSIS — F4323 Adjustment disorder with mixed anxiety and depressed mood: Secondary | ICD-10-CM

## 2016-09-20 MED FILL — CITALOPRAM HBR 20 MG TABLET: 20 | 30 days supply | Qty: 30 | Fill #2

## 2016-10-12 ENCOUNTER — Ambulatory Visit (INDEPENDENT_AMBULATORY_CARE_PROVIDER_SITE_OTHER): Payer: 59 | Admitting: Psychology

## 2016-10-12 DIAGNOSIS — F4323 Adjustment disorder with mixed anxiety and depressed mood: Secondary | ICD-10-CM | POA: Diagnosis not present

## 2016-10-24 ENCOUNTER — Other Ambulatory Visit: Payer: Self-pay | Admitting: Family Medicine

## 2016-10-24 DIAGNOSIS — F339 Major depressive disorder, recurrent, unspecified: Secondary | ICD-10-CM

## 2016-10-24 MED ORDER — TRAZODONE HCL 50 MG PO TABS: ORAL_TABLET | ORAL | 3 refills | Status: DC

## 2016-10-24 MED FILL — CITALOPRAM HBR 20 MG TABLET: 20 | 30 days supply | Qty: 30 | Fill #0

## 2016-10-25 ENCOUNTER — Telehealth: Payer: Self-pay | Admitting: Family Medicine

## 2016-10-25 NOTE — Telephone Encounter (Signed)
Pharmacy needs clarification on trazodone directions. Please call [phone] ep

## 2016-10-26 ENCOUNTER — Other Ambulatory Visit: Payer: Self-pay | Admitting: Family Medicine

## 2016-10-26 DIAGNOSIS — F339 Major depressive disorder, recurrent, unspecified: Secondary | ICD-10-CM

## 2016-10-26 MED ORDER — TRAZODONE HCL 50 MG PO TABS: ORAL_TABLET | ORAL | 3 refills | Status: DC

## 2016-10-31 ENCOUNTER — Ambulatory Visit: Payer: Self-pay | Admitting: Psychology

## 2016-11-06 IMAGING — NM NM GASTRIC EMPTYING
5 series · 5 of 5 positions shown · non-contrast
Comparison: None.

CLINICAL DATA: Abdomen pain after dinner for 3 weeks.

EXAM:
NUCLEAR MEDICINE GASTRIC EMPTYING SCAN
TECHNIQUE: After oral ingestion of radiolabeled meal, sequential abdominal
images were obtained for 4 hours. Percentage of activity emptying
the stomach was calculated at 1 hour, 2 hour, 3 hour, and 4 hours.
RADIOPHARMACEUTICALS:  2.1 millicurie mCi Bc-QQm sulfur colloid in
standardized meal

[Series 1: 0 min · 4.14mm/px · 1 of 1 slices shown]
[im 1/1]
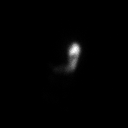

[Series 2: 1 hr · 4.14mm/px · 1 of 1 slices shown]
[im 1/1]
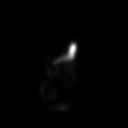

[Series 3: 2 hr · 4.14mm/px · 1 of 1 slices shown]
[im 1/1]
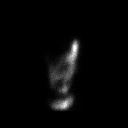

[Series 4: 3 hr · 4.14mm/px · 1 of 1 slices shown]
[im 1/1]
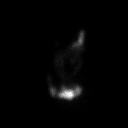

[Series 5: 4 hr · 4.14mm/px · 1 of 1 slices shown]
[im 1/1]
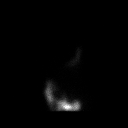

[5 of 5 positions shown; findings below may reference images not displayed]

FINDINGS: Expected location of the stomach in the left upper quadrant.
Ingested meal empties the stomach gradually over the course of the
study.

11% emptied at 1 hr ( normal >= 10%)

61% emptied at 2 hr ( normal >= 40%)

81% emptied at 3 hr ( normal >= 70%)

94% emptied at 4 hr ( normal >= 90%)
IMPRESSION: Normal  gastric emptying study.

## 2016-12-05 MED FILL — CITALOPRAM HBR 20 MG TABLET: 20 | 30 days supply | Qty: 30 | Fill #1

## 2017-01-12 ENCOUNTER — Encounter: Payer: Self-pay | Admitting: Family Medicine

## 2017-01-12 ENCOUNTER — Ambulatory Visit (INDEPENDENT_AMBULATORY_CARE_PROVIDER_SITE_OTHER): Payer: 59 | Admitting: Family Medicine

## 2017-01-12 VITALS — BP 118/76 | HR 90 | Temp 98.6°F | Ht 74.0 in | Wt 147.0 lb

## 2017-01-12 DIAGNOSIS — R636 Underweight: Secondary | ICD-10-CM | POA: Diagnosis not present

## 2017-01-12 DIAGNOSIS — K929 Disease of digestive system, unspecified: Secondary | ICD-10-CM

## 2017-01-12 DIAGNOSIS — F339 Major depressive disorder, recurrent, unspecified: Secondary | ICD-10-CM

## 2017-01-12 DIAGNOSIS — F410 Panic disorder [episodic paroxysmal anxiety] without agoraphobia: Secondary | ICD-10-CM

## 2017-01-12 NOTE — Patient Instructions (Addendum)
Taper off the Celexa (citalopram) - Take one talbet every other day for two weeks - Then take one tablet every third day for two weeks - Then stop  If your nausea and vomiting returns, restart your Celexa (citalopram) 20 mg daily. Call Dr Tris Howell to let him know what is going on.    If you are not able to stay hydrated, you will need to come back to the family medicine office to get check out.   Your Vitamin D level that Dr Alycia RossettiKoch measured in January was very low.  I recommend you take Vitamin D 2000 units daily to help you build and maintain strong bones.     I would like to see you back in 4 months to see how your weight and health is doing.

## 2017-01-13 NOTE — Progress Notes (Signed)
Merrilee JanskyKweisi D Prothero is alone Sources of clinical information for visit is/are patient and past medical records. Nursing assessment for this office visit was reviewed with the patient for accuracy and revision.   HPI  Problem List Items Addressed This Visit      Medium   Underweight - Primary - onset over a year ago - Eating more, keeping food down.   - working out at gym - Feeling better overall   Functional gastrointestinal disorder, cyles of vomiting - onset about a year ago - able to keep down meals.  Eating much more without difficlty.  - No nausea vomiting except when he had a panic attack about a month ago.      Unprioritized   Panic disorder - Onset about a year - Last event about a month ago - Has been on citalopram since 02/2016 - No headache, abnormal movements, no excess sleepiness  - Often associated with time periods of interpersonal conflict    Major depressive disorder, recurrent episode with anxious distress (HCC) - Onset about a year ago - denies ahedonia, denies prolonged sadness, denies thoughts of slef harm - PHQ9score = 2 - Has been taking citlopram about 9 months. Has been taking citalopram less than daily for last month or so, last week took for 5 out of 7 days.  - Interested in stopping citalopram.    Vitmain D deficiency - [facility] found 25(OH) Vit D level = 7 (06/2016) - Not taking MVI or vitamin D supplement - no muscle ahces or weakness  Recent Chlamydia urethritis infection - Treated twice in 07/2016 with Azithromycin bolus and Doxy course. - Retest urine GC/Chlmydia DNA probe in 07/2016 was negative - Tested for GC/Chlamydia, HIV and PRP two weeks ago at outside clinic.  Has not heard results yet.   - No urethral drainage or penile lesions.  No flu-like symptoms.  - No longer in sexual relationship  SH: No smoking  ROS See HPI  Physical Exam VS reviewed GEN: Alert, Cooperative, Groomed, NAD HEENT: PERRL; EAC  bilaterally not occluded, TM's translucent with normal LM, (+) LR;                No cervical LAN, No thyromegaly, No palpable masses COR: RRR, No M/G/R, No JVD, Normal PMI size and location LUNGS: BCTA, No Acc mm use, speaking in full sentences Gait: Normal speed, No significant path deviation, Step through +,  Psych: Normal affect/thought/speech/language  A/P See problem list

## 2017-01-13 NOTE — Assessment & Plan Note (Addendum)
Established problem that has improved.  On citalopram for ~ 9 months with toleration of medication.   PHQ9 score of 2 (zero for ahedonia and for prolonged sadness) Stressors have been reduced by change in his interpersonal relationships and his work situation.  Patient requesting trial off of citalopram which seems reasonable given therapy of at least 9 months, significant improvement in depressive and anxious symptoms, reduction in life stressors.  Recommend titration Citalopram 20 mg QOD for 2 weeks then Citalopram 20 mg every third day then Stop Samuel Townsend is to notify our office if significant recurrence in depression, anxiety/panic, or GI symptoms with reduction in citalopram.  RTC in 4 months.

## 2017-01-13 NOTE — Assessment & Plan Note (Signed)
Established problem that has improved.  Weight and appetite improved. Only nauseous when active panic attack occurs Monitor for worsening as patient titrates off of citalopram over next month.

## 2017-01-13 NOTE — Assessment & Plan Note (Signed)
Established problem that has improved.  BMI now in normal range. Improved appetite and toleration of food Increased physical activity including working out at gym. Monitor for now.

## 2017-01-13 NOTE — Assessment & Plan Note (Signed)
Established problem that has improved.  Last attack about a month ago and related to stress with working at [employer].  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  Pt has been on SSRI for about 9 months now He would like to try a trial off of citalopram which seems reasonable given the relative stability compared to the disabiling symptoms at the beginning of therapy.  Recommended a titration off of SSRI( See Major depression problem for details)

## 2017-04-21 ENCOUNTER — Encounter: Payer: Self-pay | Admitting: *Deleted

## 2017-04-21 ENCOUNTER — Emergency Department (INDEPENDENT_AMBULATORY_CARE_PROVIDER_SITE_OTHER)
Admission: EM | Admit: 2017-04-21 | Discharge: 2017-04-21 | Disposition: A | Discharge status: Home / Self Care | Payer: 59 | Source: Home / Self Care | Attending: Family Medicine | Admitting: Family Medicine

## 2017-04-21 DIAGNOSIS — R369 Urethral discharge, unspecified: Secondary | ICD-10-CM

## 2017-04-21 DIAGNOSIS — Z711 Person with feared health complaint in whom no diagnosis is made: Secondary | ICD-10-CM

## 2017-04-21 NOTE — Discharge Instructions (Signed)
°  Your results should come back within the next 24-48 hours.  You will be notified even if results are normal.  If one or both tests are positive, you may need to come in for treatment and be sure to have your partner(s) tested and treated as well. Be sure to practice safe sex by wearing condoms, which can help prevent the spread of sexually transmitted infections.

## 2017-04-21 NOTE — ED Triage Notes (Addendum)
Patient c/o white penile discharge this AM with a "tingling irritation". He would like the urine test for G/C Chlamydia.  PASSWORD: Roman [phone]
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
## 2017-04-22 ENCOUNTER — Telehealth: Payer: Self-pay | Admitting: Emergency Medicine

## 2017-04-22 LAB — C. TRACHOMATIS/N. GONORRHOEAE RNA
C. trachomatis RNA, TMA: NOT DETECTED
N. gonorrhoeae RNA, TMA: NOT DETECTED

## 2017-04-22 NOTE — Telephone Encounter (Signed)
After receiving appropriate password, patient was informed his labs were negative. Advised him that due to possible exposure only 24 hours before visit, it would be wise to follow up if any symptoms should arise.

## 2017-04-23 ENCOUNTER — Encounter: Payer: Self-pay | Admitting: Emergency Medicine

## 2017-04-23 ENCOUNTER — Emergency Department (INDEPENDENT_AMBULATORY_CARE_PROVIDER_SITE_OTHER)
Admission: EM | Admit: 2017-04-23 | Discharge: 2017-04-23 | Disposition: A | Discharge status: Home / Self Care | Payer: 59 | Source: Home / Self Care

## 2017-04-23 DIAGNOSIS — N342 Other urethritis: Secondary | ICD-10-CM

## 2017-04-23 MED ORDER — DOXYCYCLINE HYCLATE 100 MG PO CAPS: 100.0000 mg | ORAL_CAPSULE | Freq: Two times a day (BID) | ORAL | 0 refills | Status: DC

## 2017-04-23 NOTE — ED Triage Notes (Signed)
C/O Penile Discharge and tingling sensation

## 2017-04-24 MED FILL — DOXYCYCLINE HYCLATE 100 MG: 100 | 10 days supply | Qty: 20 | Fill #0
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
## 2017-05-16 ENCOUNTER — Encounter (HOSPITAL_COMMUNITY): Payer: Self-pay | Admitting: Emergency Medicine

## 2017-05-16 ENCOUNTER — Ambulatory Visit (HOSPITAL_COMMUNITY)
Admission: EM | Admit: 2017-05-16 | Discharge: 2017-05-16 | Disposition: A | Discharge status: Home / Self Care | Payer: 59 | Attending: Family Medicine | Admitting: Family Medicine

## 2017-05-16 DIAGNOSIS — Z818 Family history of other mental and behavioral disorders: Secondary | ICD-10-CM | POA: Diagnosis not present

## 2017-05-16 DIAGNOSIS — R300 Dysuria: Secondary | ICD-10-CM | POA: Insufficient documentation

## 2017-05-16 DIAGNOSIS — Z8249 Family history of ischemic heart disease and other diseases of the circulatory system: Secondary | ICD-10-CM | POA: Insufficient documentation

## 2017-05-16 DIAGNOSIS — Z91018 Allergy to other foods: Secondary | ICD-10-CM | POA: Diagnosis not present

## 2017-05-16 LAB — POCT URINALYSIS DIP (DEVICE)
Bilirubin Urine: NEGATIVE
GLUCOSE, UA: NEGATIVE mg/dL
Hgb urine dipstick: NEGATIVE
Ketones, ur: NEGATIVE mg/dL
Leukocytes, UA: NEGATIVE
Nitrite: NEGATIVE
PH: 6.5 (ref 5.0–8.0)
PROTEIN: NEGATIVE mg/dL
Specific Gravity, Urine: >=1.03 (ref 1.005–1.030)
UROBILINOGEN UA: 0.2 mg/dL (ref 0.0–1.0)
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
## 2017-05-16 NOTE — ED Triage Notes (Signed)
PT C/O: intermittent tingling penile discomfort  ONSET: 2 days  SX INCLUDE:   DENIES: dysuria, fevers, abd pain  TAKING MEDS: hydroxyzine w/some relief.   Reports [facility] [place] [facility] sx  Sexually active w/no condom use  Was tested for STIs which was negative.   A&O x4... NAD... Ambulatory

## 2017-05-16 NOTE — Discharge Instructions (Addendum)
No evidence of urinary tract infection.  We tested for Gonorrhea, Chlamydia, and Trichomonas. Will notify you of results in 48-72 hours.

## 2017-05-16 NOTE — ED Notes (Signed)
Obtained clean and dirty urine

## 2017-05-17 LAB — URINE CYTOLOGY ANCILLARY ONLY
Chlamydia: NEGATIVE
NEISSERIA GONORRHEA: NEGATIVE
TRICH (WINDOWPATH): NEGATIVE

## 2017-05-18 LAB — URINE CULTURE
CULTURE: NO GROWTH
Special Requests: NORMAL

## 2017-06-22 ENCOUNTER — Encounter: Payer: Self-pay | Admitting: Emergency Medicine

## 2017-06-22 ENCOUNTER — Emergency Department
Admission: EM | Admit: 2017-06-22 | Discharge: 2017-06-22 | Disposition: A | Discharge status: Home / Self Care | Payer: 59 | Source: Home / Self Care | Attending: Family Medicine | Admitting: Family Medicine

## 2017-06-22 ENCOUNTER — Other Ambulatory Visit: Payer: Self-pay | Admitting: Family Medicine

## 2017-06-22 ENCOUNTER — Other Ambulatory Visit: Payer: Self-pay | Admitting: Emergency Medicine

## 2017-06-22 ENCOUNTER — Other Ambulatory Visit: Payer: Self-pay

## 2017-06-22 DIAGNOSIS — R369 Urethral discharge, unspecified: Secondary | ICD-10-CM | POA: Diagnosis not present

## 2017-06-22 DIAGNOSIS — R300 Dysuria: Secondary | ICD-10-CM | POA: Diagnosis not present

## 2017-06-22 DIAGNOSIS — R05 Cough: Secondary | ICD-10-CM

## 2017-06-22 DIAGNOSIS — R059 Cough, unspecified: Secondary | ICD-10-CM

## 2017-06-22 LAB — POCT URINALYSIS DIP (MANUAL ENTRY)
Bilirubin, UA: NEGATIVE
Blood, UA: NEGATIVE
Glucose, UA: NEGATIVE mg/dL
Ketones, POC UA: NEGATIVE mg/dL
Leukocytes, UA: NEGATIVE
Nitrite, UA: NEGATIVE
PH UA: 5.5 (ref 5.0–8.0)
PROTEIN UA: NEGATIVE mg/dL
SPEC GRAV UA: 1.025 (ref 1.010–1.025)
UROBILINOGEN UA: 0.2 U/dL
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
## 2017-06-22 NOTE — Discharge Instructions (Signed)
Increase fluid intake.

## 2017-06-23 ENCOUNTER — Telehealth: Payer: Self-pay

## 2017-06-23 LAB — URINE CULTURE
MICRO NUMBER: 81452167
SPECIMEN QUALITY:: ADEQUATE

## 2017-06-23 LAB — C. TRACHOMATIS/N. GONORRHOEAE RNA
C. trachomatis RNA, TMA: NOT DETECTED
N. gonorrhoeae RNA, TMA: NOT DETECTED

## 2017-06-23 NOTE — Telephone Encounter (Signed)
Notified patient of results. Feeling better.

## 2017-06-26 ENCOUNTER — Other Ambulatory Visit: Payer: Self-pay | Admitting: Family Medicine

## 2017-06-26 DIAGNOSIS — R05 Cough: Secondary | ICD-10-CM

## 2017-06-26 DIAGNOSIS — R059 Cough, unspecified: Secondary | ICD-10-CM

## 2017-06-26 DIAGNOSIS — R053 Chronic cough: Secondary | ICD-10-CM

## 2017-07-07 ENCOUNTER — Other Ambulatory Visit: Payer: Self-pay | Admitting: Family Medicine

## 2017-07-07 DIAGNOSIS — F339 Major depressive disorder, recurrent, unspecified: Secondary | ICD-10-CM

## 2017-07-07 MED ORDER — CITALOPRAM HYDROBROMIDE 20 MG PO TABS: ORAL_TABLET | ORAL | 3 refills | Status: DC

## 2017-07-07 MED FILL — CITALOPRAM HBR 20 MG TABLET: 20 | 30 days supply | Qty: 30 | Fill #0

## 2017-07-07 NOTE — Progress Notes (Signed)
Restart of citalopram 20 mg daily.  Recurrence of depression by report of mother.

## 2017-07-11 ENCOUNTER — Other Ambulatory Visit: Payer: Self-pay

## 2017-07-11 ENCOUNTER — Ambulatory Visit (INDEPENDENT_AMBULATORY_CARE_PROVIDER_SITE_OTHER): Payer: No Typology Code available for payment source | Admitting: Student

## 2017-07-11 ENCOUNTER — Encounter: Payer: Self-pay | Admitting: Student

## 2017-07-11 VITALS — BP 124/80 | HR 76 | Temp 97.9°F | Ht 74.0 in | Wt 152.0 lb

## 2017-07-11 DIAGNOSIS — R05 Cough: Secondary | ICD-10-CM | POA: Diagnosis not present

## 2017-07-11 DIAGNOSIS — R059 Cough, unspecified: Secondary | ICD-10-CM

## 2017-07-11 NOTE — Patient Instructions (Signed)
It was great seeing you today! We have addressed the following issues today  Cough: This is likely due to allergy.  I recommend trying Zyrtec.  You can also alternate this with Allegra or Claritin.  If it does not improve with this, you can try Flonase nasal spray which is available over-the-counter.   If we did any lab work today, and the results require attention, either me or my nurse will get in touch with you. If everything is normal, you will get a letter in mail and a message via . If you don't hear from us in two weeks, please give us a call. Otherwise, we look forward to seeing you again at your next visit. If you have any questions or concerns before then, please call the clinic at [phone].  Please bring all your medications to every doctors visit  Sign up for My Chart to have easy access to your labs results, and communication with your Primary care physician.    Please check-out at the front desk before leaving the clinic.    Take Care,   Dr. Alanda SlimGonfa
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
## 2017-07-17 ENCOUNTER — Other Ambulatory Visit: Payer: Self-pay

## 2017-07-17 ENCOUNTER — Ambulatory Visit (HOSPITAL_COMMUNITY)
Admission: EM | Admit: 2017-07-17 | Discharge: 2017-07-17 | Disposition: A | Discharge status: Home / Self Care | Payer: No Typology Code available for payment source | Attending: Family Medicine | Admitting: Family Medicine

## 2017-07-17 ENCOUNTER — Encounter (HOSPITAL_COMMUNITY): Payer: Self-pay | Admitting: Emergency Medicine

## 2017-07-17 DIAGNOSIS — N342 Other urethritis: Secondary | ICD-10-CM | POA: Diagnosis not present

## 2017-07-17 LAB — POCT URINALYSIS DIP (DEVICE)
BILIRUBIN URINE: NEGATIVE
Glucose, UA: NEGATIVE mg/dL
Hgb urine dipstick: NEGATIVE
KETONES UR: NEGATIVE mg/dL
Leukocytes, UA: NEGATIVE
Nitrite: NEGATIVE
PROTEIN: NEGATIVE mg/dL
Specific Gravity, Urine: 1.015 (ref 1.005–1.030)
Urobilinogen, UA: 0.2 mg/dL (ref 0.0–1.0)
pH: 7.5 (ref 5.0–8.0)

## 2017-07-17 MED ORDER — PHENAZOPYRIDINE HCL 200 MG PO TABS: 200.0000 mg | ORAL_TABLET | Freq: Three times a day (TID) | ORAL | 0 refills | Status: DC

## 2017-07-17 MED FILL — PHENAZOPYRIDINE 200 MG TAB: 200 | 2 days supply | Qty: 6 | Fill #0

## 2017-07-17 NOTE — ED Triage Notes (Signed)
Pt states hes been to several UC for the same thing, states hes had on and off irritation with urination. Pt states "ive been checked for everything and nothing has shown up". Pt states yesterday he peed and it burned.

## 2017-07-18 ENCOUNTER — Ambulatory Visit (INDEPENDENT_AMBULATORY_CARE_PROVIDER_SITE_OTHER): Payer: No Typology Code available for payment source | Admitting: Psychology

## 2017-07-18 DIAGNOSIS — F411 Generalized anxiety disorder: Secondary | ICD-10-CM
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
## 2017-07-27 ENCOUNTER — Ambulatory Visit: Payer: No Typology Code available for payment source | Admitting: Emergency Medicine

## 2017-07-27 ENCOUNTER — Encounter: Payer: Self-pay | Admitting: Emergency Medicine

## 2017-07-27 DIAGNOSIS — R05 Cough: Secondary | ICD-10-CM

## 2017-07-27 DIAGNOSIS — R059 Cough, unspecified: Secondary | ICD-10-CM

## 2017-07-27 NOTE — Assessment & Plan Note (Signed)
Started up again in October.  Question whether allergic rhinitis is the biggest contributor here.  We still have not ruled out asthma.  He is not having any GERD symptoms.  He does do a lot of throat clearing and we talked about avoiding this.  He needs pulmonary function testing, repeat chest x-ray.  Continue his allergy regimen.  Please continue your loratadine (Claritin) as you have been taking it If you notice that you have congestion even on the loratadine then you could consider starting a nasal spray like Flonase or Nasacort. We will perform full pulmonary function testing We will perform a chest x-ray at your next office visit. Follow with Dr Delton CoombesByrum next available to review your testing.

## 2017-07-27 NOTE — Patient Instructions (Signed)
Please continue your loratadine (Claritin) as you have been taking it If you notice that you have congestion even on the loratadine then you could consider starting a nasal spray like Flonase or Nasacort. We will perform full pulmonary function testing We will perform a chest x-ray at your next office visit. Follow with Dr Delton CoombesByrum next available to review your testing.
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
## 2017-08-29 ENCOUNTER — Ambulatory Visit: Payer: Self-pay | Admitting: Emergency Medicine

## 2017-08-29 ENCOUNTER — Other Ambulatory Visit: Payer: Self-pay | Admitting: Emergency Medicine

## 2017-08-29 DIAGNOSIS — R059 Cough, unspecified: Secondary | ICD-10-CM

## 2017-08-29 DIAGNOSIS — R05 Cough: Secondary | ICD-10-CM

## 2017-11-03 ENCOUNTER — Ambulatory Visit (INDEPENDENT_AMBULATORY_CARE_PROVIDER_SITE_OTHER): Payer: No Typology Code available for payment source | Admitting: Psychology

## 2017-11-03 DIAGNOSIS — F411 Generalized anxiety disorder: Secondary | ICD-10-CM

## 2017-12-15 ENCOUNTER — Ambulatory Visit (INDEPENDENT_AMBULATORY_CARE_PROVIDER_SITE_OTHER): Payer: No Typology Code available for payment source | Admitting: Psychology

## 2017-12-15 DIAGNOSIS — F411 Generalized anxiety disorder: Secondary | ICD-10-CM

## 2017-12-26 ENCOUNTER — Ambulatory Visit (INDEPENDENT_AMBULATORY_CARE_PROVIDER_SITE_OTHER): Payer: No Typology Code available for payment source | Admitting: Psychology

## 2017-12-26 DIAGNOSIS — F411 Generalized anxiety disorder: Secondary | ICD-10-CM

## 2018-01-11 ENCOUNTER — Ambulatory Visit: Payer: Self-pay | Admitting: Psychology

## 2018-01-25 ENCOUNTER — Ambulatory Visit (INDEPENDENT_AMBULATORY_CARE_PROVIDER_SITE_OTHER): Payer: No Typology Code available for payment source | Admitting: Psychology

## 2018-01-25 DIAGNOSIS — F411 Generalized anxiety disorder: Secondary | ICD-10-CM | POA: Diagnosis not present

## 2018-02-08 ENCOUNTER — Ambulatory Visit (INDEPENDENT_AMBULATORY_CARE_PROVIDER_SITE_OTHER): Payer: No Typology Code available for payment source | Admitting: Psychology

## 2018-02-08 DIAGNOSIS — F410 Panic disorder [episodic paroxysmal anxiety] without agoraphobia: Secondary | ICD-10-CM | POA: Diagnosis not present

## 2018-02-22 ENCOUNTER — Ambulatory Visit (INDEPENDENT_AMBULATORY_CARE_PROVIDER_SITE_OTHER): Payer: No Typology Code available for payment source | Admitting: Psychology

## 2018-02-22 DIAGNOSIS — F411 Generalized anxiety disorder: Secondary | ICD-10-CM | POA: Diagnosis not present

## 2018-03-22 ENCOUNTER — Ambulatory Visit (INDEPENDENT_AMBULATORY_CARE_PROVIDER_SITE_OTHER): Payer: No Typology Code available for payment source | Admitting: Psychology

## 2018-03-22 DIAGNOSIS — F411 Generalized anxiety disorder: Secondary | ICD-10-CM

## 2018-05-17 ENCOUNTER — Ambulatory Visit (INDEPENDENT_AMBULATORY_CARE_PROVIDER_SITE_OTHER): Payer: No Typology Code available for payment source | Admitting: Psychology

## 2018-05-17 DIAGNOSIS — F411 Generalized anxiety disorder: Secondary | ICD-10-CM

## 2018-06-07 ENCOUNTER — Ambulatory Visit (INDEPENDENT_AMBULATORY_CARE_PROVIDER_SITE_OTHER): Payer: No Typology Code available for payment source | Admitting: Psychology

## 2018-06-07 DIAGNOSIS — F411 Generalized anxiety disorder: Secondary | ICD-10-CM

## 2018-07-11 ENCOUNTER — Ambulatory Visit (INDEPENDENT_AMBULATORY_CARE_PROVIDER_SITE_OTHER): Payer: No Typology Code available for payment source | Admitting: Psychology

## 2018-07-11 DIAGNOSIS — F411 Generalized anxiety disorder: Secondary | ICD-10-CM

## 2018-08-14 ENCOUNTER — Ambulatory Visit: Payer: No Typology Code available for payment source | Admitting: Psychology

## 2018-12-13 ENCOUNTER — Encounter: Payer: Self-pay | Admitting: Physician Assistant

## 2018-12-13 ENCOUNTER — Ambulatory Visit (INDEPENDENT_AMBULATORY_CARE_PROVIDER_SITE_OTHER): Payer: No Typology Code available for payment source | Admitting: Physician Assistant

## 2018-12-13 VITALS — BP 112/72 | HR 86 | Temp 98.6°F | Ht 75.0 in | Wt 147.2 lb

## 2018-12-13 DIAGNOSIS — R0989 Other specified symptoms and signs involving the circulatory and respiratory systems: Secondary | ICD-10-CM | POA: Diagnosis not present

## 2018-12-13 MED ORDER — OMEPRAZOLE 40 MG PO CPDR: 40.0000 mg | DELAYED_RELEASE_CAPSULE | ORAL | 8 refills | Status: DC

## 2018-12-13 MED FILL — OMEPRAZOLE DR 40 MG CAPSULE: 40 | 30 days supply | Qty: 30 | Fill #0

## 2018-12-13 NOTE — Progress Notes (Signed)
Subjective:    Patient ID: Samuel JanskyKweisi D Townsend, male    DOB: 1990-08-09, 28 y.o.   MRN: [id number]  HPI Karl BalesKweisi is a pleasant 28 year old African-American male, new to GI today [facility]/Dr. McDiarmod for dysphasia. Patient is generally been in good health, does have history of depression.  He had hospitalization in 2017 with complaints of nausea, and those records have been reviewed.  He was seen by [facility] during that admission.  He had EGD with Dr. Evette CristalGanem in September 2017 which was normal exam.  Gastric emptying scan was also normal as well as CCK HIDA scan.  Patient believes that his problem at that point was primarily anxiety. He comes in today with complaints of a sensation of fullness in his throat or not sensation intermittently.  This is been present for a little over a year.  He is not having any dysphagia or odynophagia.  Sometimes when he is swallowing he senses the fullness.  It seems to be most prominent if he laughs hard which may precipitate gagging and has also noticed at times with brushing his teeth and he does not feel that he is particularly anxious at this point.  He has no complaints of heartburn or indigestion, no active reflux symptoms that he is aware of.  Appetite is been good and weight has been stable.  Review of Systems Pertinent positive and negative review of systems were noted in the above HPI section.  All other review of systems was otherwise negative.  Outpatient Encounter Medications as of 12/13/2018  Medication Sig  . loratadine (CLARITIN) 10 MG tablet Take 10 mg by mouth daily.  Marland Kitchen. omeprazole (PRILOSEC) 40 MG capsule Take 1 capsule (40 mg total) by mouth every morning. Take 30 minutes before breakfast  . [DISCONTINUED] citalopram (CELEXA) 20 MG tablet One tablet daily (Patient not taking: Reported on 07/17/2017)   No facility-administered encounter medications on file as of 12/13/2018.    Allergies  Allergen Reactions  . Coconut Flavor  Itching  . Mushroom Extract Complex Itching and Swelling   Patient Active Problem List   Diagnosis Date Noted  . Major depressive disorder, recurrent episode with anxious distress (HCC) 07/15/2016  . Posttraumatic stress disorder 07/01/2016  . Panic disorder 03/22/2016  . Functional gastrointestinal disorder, cyles of vomiting 03/04/2016  . Cough 01/28/2016  . Underweight 11/18/2014   Social History   Socioeconomic History  . Marital status: Single    Spouse name: Not on file  . Number of children: Not on file  . Years of education: 6215  . Highest education level: Not on file  Occupational History  . Occupation: Building control surveyorlab tech    Employer: [employer]  Social Needs  . Financial resource strain: Not on file  . Food insecurity    Worry: Not on file    Inability: Not on file  . Transportation needs    Medical: Not on file    Non-medical: Not on file  Tobacco Use  . Smoking status: Never Smoker  . Smokeless tobacco: Never Used  Substance and Sexual Activity  . Alcohol use: Yes    Alcohol/week: 6.0 standard drinks    Types: 6 Glasses of wine per week  . Drug use: No  . Sexual activity: Yes    Birth control/protection: Condom  Lifestyle  . Physical activity    Days per week: Not on file    Minutes per session: Not on file  . Stress: Not on file  Relationships  .  Social Herbalist on phone: Not on file    Gets together: Not on file    Attends religious service: Not on file    Active member of club or organization: Not on file    Attends meetings of clubs or organizations: Not on file    Relationship status: Not on file  . Intimate partner violence    Fear of current or ex partner: Not on file    Emotionally abused: Not on file    Physically abused: Not on file    Forced sexual activity: Not on file  Other Topics Concern  . Not on file  Social History Narrative  . Not on file    Mr. Goodpasture family history includes Cancer in his maternal grandmother; Diabetes  in his father and maternal grandmother; Heart disease in his maternal grandmother; Hyperlipidemia in his maternal grandmother; Mental illness in his father; Sarcoidosis in his mother; Stroke in his paternal grandmother.      Objective:    Vitals:   12/13/18 0946  BP: 112/72  Pulse: 86  Temp: 98.6 F (37 C)    Physical Exam; Well-developed well-nourished young African-American male in no acute distress.  , BMI 18.4  HEENT; nontraumatic normocephalic, EOMI, PE R LA, sclera anicteric. Oropharynx; not examined, wearing mask/COVID crisis Neck; supple, no JVD Cardiovascular; regular rate and rhythm with S1-S2, no murmur rub or gallop Pulmonary; Clear bilaterally Abdomen; soft, nontender, nondistended, no palpable mass or hepatosplenomegaly, bowel sounds are active Rectal; not done Skin; benign exam, no jaundice rash or appreciable lesions Extremities; no clubbing cyanosis or edema skin warm and dry Neuro/Psych; alert and oriented x4, grossly nonfocal mood and affect appropriate       Assessment & Plan:   #4 28 year old African-American male with 1 year history of sensation of fullness in the throat or knot sensation, no associated heartburn indigestion or obvious reflux, no dysphasia. I suspect he has globus symptoms, cannot rule out GERD  #2 history of depression  Plan; patient will be scheduled for a barium swallow with tablet. Start omeprazole 40 mg 1 p.o. every morning AC breakfast.  He is asked to take this regularly over the next 4 to 6 weeks to assess for improvement in symptoms. Further plans pending results of barium swallow.   Patient will be established with Dr. Ancil Linsey PA-C 12/13/2018   Cc: McDiarmid, Blane Ohara, MD

## 2018-12-13 NOTE — Patient Instructions (Signed)
If you are age 26 or older, your body mass index should be between 23-30. Your Body mass index is 18.4 kg/m. If this is out of the aforementioned range listed, please consider follow up with your Primary Care Provider.  If you are age 41 or younger, your body mass index should be between 19-25. Your Body mass index is 18.4 kg/m. If this is out of the aformentioned range listed, please consider follow up with your Primary Care Provider.   We have sent the following medications to your pharmacy for you to pick up at your convenience: Omeprazole  You have been scheduled for a Barium Esophogram at [facility] (1st floor of the hospital) on 12/24/18 at 2:30 pm. Please arrive 15 minutes prior to your appointment for registration. Make certain not to have anything to eat or drink 3 hours prior to your test. If you need to reschedule for any reason, please contact radiology at [phone] to do so. __________________________________________________________________ A barium swallow is an examination that concentrates on views of the esophagus. This tends to be a double contrast exam (barium and two liquids which, when combined, create a gas to distend the wall of the oesophagus) or single contrast (non-ionic iodine based). The study is usually tailored to your symptoms so a good history is essential. Attention is paid during the study to the form, structure and configuration of the esophagus, looking for functional disorders (such as aspiration, dysphagia, achalasia, motility and reflux) EXAMINATION You may be asked to change into a gown, depending on the type of swallow being performed. A radiologist and radiographer will perform the procedure. The radiologist will advise you of the type of contrast selected for your procedure and direct you during the exam. You will be asked to stand, sit or lie in several different positions and to hold a small amount of fluid in your mouth before being asked to  swallow while the imaging is performed .In some instances you may be asked to swallow barium coated marshmallows to assess the motility of a solid food bolus. The exam can be recorded as a digital or video fluoroscopy procedure. POST PROCEDURE It will take 1-2 days for the barium to pass through your system. To facilitate this, it is important, unless otherwise directed, to increase your fluids for the next 24-48hrs and to resume your normal diet.  This test typically takes about 30 minutes to perform.   Thank you for choosing me and [facility].   Amy Esterwood, PA-C

## 2018-12-24 ENCOUNTER — Ambulatory Visit (HOSPITAL_COMMUNITY): Admission: RE | Admit: 2018-12-24 | Payer: No Typology Code available for payment source | Source: Ambulatory Visit

## 2019-01-17 NOTE — Progress Notes (Signed)
Reviewed and agree with documentation and assessment and plan. K. Veena Jelitza Manninen , MD   

## 2019-06-10 ENCOUNTER — Telehealth (INDEPENDENT_AMBULATORY_CARE_PROVIDER_SITE_OTHER): Payer: No Typology Code available for payment source | Admitting: Family Medicine

## 2019-06-10 ENCOUNTER — Other Ambulatory Visit: Payer: Self-pay

## 2019-06-10 ENCOUNTER — Encounter: Payer: Self-pay | Admitting: Family Medicine

## 2019-06-10 DIAGNOSIS — F339 Major depressive disorder, recurrent, unspecified: Secondary | ICD-10-CM

## 2019-06-10 MED ORDER — CITALOPRAM HYDROBROMIDE 20 MG PO TABS: 20.0000 mg | ORAL_TABLET | Freq: Every day | ORAL | 1 refills | Status: DC

## 2019-06-10 MED FILL — CITALOPRAM HBR 20 MG TABLET: 20 | 90 days supply | Qty: 90 | Fill #0

## 2019-06-10 NOTE — Progress Notes (Signed)
[facility] Visit  Patient consented to have virtual visit. Method of visit: Video  Encounter participants: Patient: Samuel Townsend - located at home Provider: Rory Percy - located at [facility] Others (if applicable): n/a  Chief Complaint: Worsening anxiety/depression  HPI:  Anxiety - H/o panic attacks and PTSD, MDD, last noted in 2018. - Medications: None currently, previously on citalopram but titrated off in 2018 as he was doing well at that time. - Taking: n/a - Current stressors: Work, works in [employer], has had an increase in workload due to increased [employer].  Is currently working 7 days/week and working overtime. - Has been having a difficult time initiating and staying asleep, taking melatonin 5 mg. 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  Also reports a couple emotional breakdowns in the last 2 weeks. - Does report one episode of cutting his forearm this past Thursday as he became overwhelmed.  Last episode prior to this was 2 years ago. - Does have prior [facility] hospitalization in 2018 for SI. - Previously followed with Dr. Pervis Hocking for counseling but has not been seen in some time.  Plans to reestablish with her.  GAD7 - 3, 2, 2, 2, 2, 1, 0 = 12  Depression screen [facility] 2/9 06/10/2019 07/11/2017 01/12/2017  Decreased Interest 2 0 0  Down, Depressed, Hopeless 2 0 0  PHQ - 2 Score 4 0 0  Altered sleeping 3 - -  Tired, decreased energy 3 - -  Change in appetite 0 - -  Feeling bad or failure about yourself  2 - -  Trouble concentrating 3 - -  Moving slowly or fidgety/restless 3 - -  Suicidal thoughts 1 - -  PHQ-9 Score 19 - -  Difficult doing work/chores Extremely dIfficult - -    ROS: per HPI  Pertinent PMHx: panic d/o, PTSD, MDD  Exam:  Respiratory: Speaks in full  sentences, no respiratory distress Psych: Appropriately dressed and well-groomed.  Speech nonpressured.  No tangential thought process or flight of ideas.  Assessment/Plan:  Major depressive disorder, recurrent episode with anxious distress ([place]) Worsened with increased workload at work and currently not on medication or counseling.  PHQ-9 19 and GAD 12 today which is worsened from prior.  Will reinitiate citalopram at previous dose and increase melatonin to 10 mg daily, could consider trazodone at follow-up if melatonin is not helping.  Patient will reach out to Dr. Rexene Edison for reestablishment with counseling.  Patient currently without suicidal intent or plan, did discuss possibility of recurrence of SI with initiation of SSRI and instructed to stop medication immediately and call us if this occurs.  We will also provide note for decreased work duties until symptoms under better control.  Follow-up in 2 weeks for virtual visit.  Patient verbalized understanding and agreement of plan.    Time spent during visit with patient: 16 minutes

## 2019-06-10 NOTE — Assessment & Plan Note (Addendum)
Worsened with increased workload at work and currently not on medication or counseling.  PHQ-9 19 and GAD 12 today which is worsened from prior.  Will reinitiate citalopram at previous dose and increase melatonin to 10 mg daily, could consider trazodone at follow-up if melatonin is not helping.  Patient will reach out to Dr. Rexene Edison for reestablishment with counseling.  Patient currently without suicidal intent or plan, did discuss possibility of recurrence of SI with initiation of SSRI and instructed to stop medication immediately and call us if this occurs.  We will also provide note for decreased work duties until symptoms under better control.  Follow-up in 2 weeks for virtual visit.  Patient verbalized understanding and agreement of plan.

## 2019-06-13 ENCOUNTER — Other Ambulatory Visit: Payer: Self-pay | Admitting: Family Medicine

## 2019-06-13 MED ORDER — BUPROPION HCL ER (XL) 150 MG PO TB24: 150.0000 mg | ORAL_TABLET | Freq: Every day | ORAL | 0 refills | Status: DC

## 2019-06-13 MED FILL — buPROPion HCL ER (XL) 150 M: 150 | 90 days supply | Qty: 90 | Fill #0

## 2019-06-27 ENCOUNTER — Encounter: Payer: Self-pay | Admitting: Family Medicine

## 2019-07-01 ENCOUNTER — Telehealth: Payer: Self-pay

## 2019-07-01 NOTE — Telephone Encounter (Signed)
Attempted to reach pt. NO answer, VM was full. Was calling pt to see what his concern was and to schedule an appt. Aquilla Solian, CMA

## 2019-07-03 ENCOUNTER — Ambulatory Visit (INDEPENDENT_AMBULATORY_CARE_PROVIDER_SITE_OTHER): Payer: No Typology Code available for payment source | Admitting: Psychology

## 2019-07-03 DIAGNOSIS — F4323 Adjustment disorder with mixed anxiety and depressed mood: Secondary | ICD-10-CM

## 2019-07-08 ENCOUNTER — Telehealth: Payer: Self-pay

## 2019-07-08 NOTE — Telephone Encounter (Signed)
Called pt to ask COVID screening questions. No answer, mailbox is full. If pt calls, please ask the COVID screening questions for appt 07/09/2019. Sunday Spillers, CMA

## 2019-07-09 ENCOUNTER — Telehealth: Payer: Self-pay | Admitting: Family Medicine

## 2019-07-09 ENCOUNTER — Ambulatory Visit: Payer: No Typology Code available for payment source | Admitting: Family Medicine

## 2019-07-09 NOTE — Telephone Encounter (Signed)
Patient no showed appointment. Attempted to call patient to follow up concerns and reschedule appointment but did not answer and VM full.   Ellwood Dense, DO PGY-3, [facility] 07/09/2019 2:47 PM

## 2019-07-09 NOTE — Progress Notes (Deleted)
  Subjective:   Patient ID: Samuel Townsend    DOB: 29-Aug-1990, 29 y.o. male   MRN: [id number]  Samuel Townsend is a 29 y.o. male with a history of PTSD w/ panic d/o, MDD here for   Depression - Medications: wellbutrin 150mg  daily - Taking: *** - Current stressors: *** - Coping Mechanisms: ***  - ***  Review of Systems:  Per HPI.  Medications and smoking status reviewed.  Objective:   There were no vitals taken for this visit. Vitals and nursing note reviewed.  General: well nourished, well developed, in no acute distress with non-toxic appearance HEENT: normocephalic, atraumatic, moist mucous membranes Neck: supple, non-tender without lymphadenopathy CV: regular rate and rhythm without murmurs, rubs, or gallops, no lower extremity edema Lungs: clear to auscultation bilaterally with normal work of breathing Abdomen: soft, non-tender, non-distended, no masses or organomegaly palpable, normoactive bowel sounds Skin: warm, dry, no rashes or lesions Extremities: warm and well perfused, normal tone MSK: ROM grossly intact, gait normal Neuro: Alert and oriented, speech normal  Assessment & Plan:   No problem-specific Assessment & Plan notes found for this encounter.  No orders of the defined types were placed in this encounter.  No orders of the defined types were placed in this encounter.   , DO PGY-3, [facility] 07/09/2019 12:48 PM

## 2019-07-18 MED FILL — buPROPion HCL ER (XL) 150 M: 150 | 30 days supply | Qty: 30 | Fill #1

## 2019-07-23 ENCOUNTER — Ambulatory Visit (INDEPENDENT_AMBULATORY_CARE_PROVIDER_SITE_OTHER): Payer: No Typology Code available for payment source | Admitting: Psychology

## 2019-07-23 DIAGNOSIS — F411 Generalized anxiety disorder: Secondary | ICD-10-CM

## 2019-08-06 ENCOUNTER — Encounter: Payer: Self-pay | Admitting: Family Medicine

## 2019-08-08 ENCOUNTER — Other Ambulatory Visit (HOSPITAL_COMMUNITY)
Admission: RE | Admit: 2019-08-08 | Discharge: 2019-08-08 | Disposition: A | Discharge status: Home / Self Care | Payer: No Typology Code available for payment source | Source: Ambulatory Visit | Attending: Family Medicine | Admitting: Family Medicine

## 2019-08-08 ENCOUNTER — Ambulatory Visit (INDEPENDENT_AMBULATORY_CARE_PROVIDER_SITE_OTHER): Payer: No Typology Code available for payment source | Admitting: Family Medicine

## 2019-08-08 ENCOUNTER — Other Ambulatory Visit: Payer: Self-pay

## 2019-08-08 ENCOUNTER — Encounter: Payer: Self-pay | Admitting: Family Medicine

## 2019-08-08 VITALS — BP 110/72 | HR 93 | Ht 75.0 in | Wt 152.0 lb

## 2019-08-08 DIAGNOSIS — R053 Chronic cough: Secondary | ICD-10-CM

## 2019-08-08 DIAGNOSIS — Z113 Encounter for screening for infections with a predominantly sexual mode of transmission: Secondary | ICD-10-CM | POA: Diagnosis present

## 2019-08-08 DIAGNOSIS — F339 Major depressive disorder, recurrent, unspecified: Secondary | ICD-10-CM | POA: Diagnosis not present

## 2019-08-08 DIAGNOSIS — R05 Cough: Secondary | ICD-10-CM

## 2019-08-08 DIAGNOSIS — Z114 Encounter for screening for human immunodeficiency virus [HIV]: Secondary | ICD-10-CM

## 2019-08-08 DIAGNOSIS — R0989 Other specified symptoms and signs involving the circulatory and respiratory systems: Secondary | ICD-10-CM

## 2019-08-09 ENCOUNTER — Encounter: Payer: Self-pay | Admitting: Family Medicine

## 2019-08-09 DIAGNOSIS — R0989 Other specified symptoms and signs involving the circulatory and respiratory systems: Secondary | ICD-10-CM | POA: Insufficient documentation

## 2019-08-09 LAB — URINE CYTOLOGY ANCILLARY ONLY
Chlamydia: NEGATIVE
Comment: NEGATIVE
Comment: NEGATIVE
Comment: NORMAL
Neisseria Gonorrhea: NEGATIVE
Trichomonas: NEGATIVE

## 2019-08-09 LAB — RPR: RPR Ser Ql: NONREACTIVE

## 2019-08-09 LAB — HIV ANTIBODY (ROUTINE TESTING W REFLEX): HIV Screen 4th Generation wRfx: NONREACTIVE

## 2019-08-09 NOTE — Assessment & Plan Note (Signed)
Established problem that has improved.  PHQ9 06/09/20 was 19, today it is 2.   Antidepressant therapy changed for citalopram to bupropion bc of adverse effects on sexual function. It appears this adverse effect resolved with this medication change.  Samuel Townsend re-established therapeutic relationship with his counselor.   We discussed continuing the bupropion for a 6-9 month maintenance therapy.  Discuss whether he wants to continue bupropion after the maintenance phase.  Continuing counseling therapy encouraged.

## 2019-08-09 NOTE — Assessment & Plan Note (Addendum)
Established problem Uncontrolled Complaint of cough has appeared in patient's EMR sporadically since 2017. Cough became a more consistent complaint starting in early 2019.  Samuel Townsend self-referral consult with Dr Delton Coombes (Pulmonology)  [phone] with complaint of cough. Planned Pulmonary Function Tests were never done.  He returned to see Dr Delton Coombes 07/27/2017 where he entertained the differential diagnosis of Allergic Rhinitis and Asthma.  Dr Delton Coombes recommended getting the previously recommended pulmonary function test and Chest Xray.  I do not see that these tests were conducted.   Recommend Samuel Townsend follow up with Dr Delton Coombes to complete his diagnostic work-up. Ambulatory referral to Dr Delton Coombes ordered.

## 2019-08-19 MED FILL — buPROPion HCL ER (XL) 150 M: 150 | 30 days supply | Qty: 30 | Fill #2

## 2019-08-28 ENCOUNTER — Ambulatory Visit (INDEPENDENT_AMBULATORY_CARE_PROVIDER_SITE_OTHER): Payer: No Typology Code available for payment source | Admitting: Psychology

## 2019-08-28 DIAGNOSIS — F411 Generalized anxiety disorder: Secondary | ICD-10-CM

## 2019-09-24 ENCOUNTER — Other Ambulatory Visit: Payer: Self-pay | Admitting: Family Medicine

## 2019-09-25 ENCOUNTER — Other Ambulatory Visit: Payer: Self-pay | Admitting: Family Medicine

## 2019-09-25 MED FILL — buPROPion HCL ER (XL) 150 M: 150 | 30 days supply | Qty: 30 | Fill #0

## 2019-09-26 MED ORDER — BUPROPION HCL ER (XL) 150 MG PO TB24: 150.0000 mg | ORAL_TABLET | Freq: Every day | ORAL | 5 refills | Status: DC

## 2019-12-24 ENCOUNTER — Encounter (HOSPITAL_COMMUNITY): Payer: Self-pay

## 2019-12-24 ENCOUNTER — Other Ambulatory Visit: Payer: Self-pay

## 2019-12-24 ENCOUNTER — Ambulatory Visit (HOSPITAL_COMMUNITY)
Admission: EM | Admit: 2019-12-24 | Discharge: 2019-12-24 | Disposition: A | Discharge status: Home / Self Care | Payer: No Typology Code available for payment source | Attending: Urgent Care | Admitting: Urgent Care

## 2019-12-24 DIAGNOSIS — R369 Urethral discharge, unspecified: Secondary | ICD-10-CM

## 2019-12-24 DIAGNOSIS — Z7251 High risk heterosexual behavior: Secondary | ICD-10-CM | POA: Diagnosis present

## 2019-12-24 MED ORDER — AZITHROMYCIN 250 MG PO TABS
1000.0000 mg | ORAL_TABLET | Freq: Once | ORAL | Status: AC
  Administered 2019-12-24: 13:00:00 1000 mg via ORAL

## 2019-12-24 MED ORDER — LIDOCAINE HCL (PF) 1 % IJ SOLN
INTRAMUSCULAR | Status: AC
  Filled 2019-12-24: qty 2

## 2019-12-24 MED ORDER — CEFTRIAXONE SODIUM 500 MG IJ SOLR
INTRAMUSCULAR | Status: AC
  Filled 2019-12-24: qty 500

## 2019-12-24 MED ORDER — AZITHROMYCIN 250 MG PO TABS
ORAL_TABLET | ORAL | Status: AC
  Filled 2019-12-24: qty 4

## 2019-12-24 MED ORDER — CEFTRIAXONE SODIUM 500 MG IJ SOLR
500.0000 mg | Freq: Once | INTRAMUSCULAR | Status: AC
  Administered 2019-12-24: 13:00:00 500 mg via INTRAMUSCULAR

## 2019-12-24 NOTE — Discharge Instructions (Signed)
Avoid all forms of sexual intercourse (oral, vaginal, anal) for the next 7 days to avoid spreading/reinfecting. Return if symptoms worsen/do not resolve, you develop fever, abdominal pain, blood in your urine, or are re-exposed to an STI.  
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
## 2019-12-25 ENCOUNTER — Encounter: Payer: Self-pay | Admitting: Family Medicine

## 2019-12-25 LAB — CYTOLOGY, (ORAL, ANAL, URETHRAL) ANCILLARY ONLY
Chlamydia: NEGATIVE
Comment: NEGATIVE
Comment: NEGATIVE
Comment: NORMAL
Neisseria Gonorrhea: NEGATIVE
Trichomonas: NEGATIVE

## 2020-01-23 ENCOUNTER — Encounter: Payer: Self-pay | Admitting: *Deleted

## 2020-02-06 ENCOUNTER — Encounter: Payer: Self-pay | Admitting: Family Medicine

## 2020-02-06 ENCOUNTER — Ambulatory Visit (INDEPENDENT_AMBULATORY_CARE_PROVIDER_SITE_OTHER): Payer: Self-pay | Admitting: Family Medicine

## 2020-02-06 ENCOUNTER — Other Ambulatory Visit: Payer: Self-pay

## 2020-02-06 VITALS — BP 118/74 | HR 63 | Ht 75.0 in | Wt 145.0 lb

## 2020-02-06 DIAGNOSIS — N342 Other urethritis: Secondary | ICD-10-CM

## 2020-02-06 DIAGNOSIS — Z9189 Other specified personal risk factors, not elsewhere classified: Secondary | ICD-10-CM

## 2020-02-06 NOTE — Assessment & Plan Note (Signed)
New concern Samuel Townsend knows about PrEP therapy and its benefits, but does not want to start it right now.  He will let us know should he change his mind.

## 2020-02-06 NOTE — Patient Instructions (Signed)
Pruritus Ani

## 2020-02-06 NOTE — Progress Notes (Addendum)
Samuel Townsend is alone Sources of clinical information for visit is/are patient and past medical records. Nursing assessment for this office visit was reviewed with the patient for accuracy and revision.     Previous Report(s) Reviewed: lab reports and office notes  Depression screen [facility] 2/9 02/06/2020  Decreased Interest 0  Down, Depressed, Hopeless 1  PHQ - 2 Score 1  Altered sleeping 1  Tired, decreased energy 1  Change in appetite 0  Feeling bad or failure about yourself  1  Trouble concentrating 0  Moving slowly or fidgety/restless 0  Suicidal thoughts 0  PHQ-9 Score 4  Difficult doing work/chores -    Fall Risk  12/17/2015  Falls in the past year? No    PHQ9 SCORE ONLY 02/06/2020 08/08/2019 06/10/2019  PHQ-9 Total Score 4 2 19     Adult vaccines due  Topic Date Due  . TETANUS/TDAP  Never done    Health Maintenance Due  Topic Date Due  . Hepatitis C Screening  Never done  . TETANUS/TDAP  Never done  . INFLUENZA VACCINE  01/26/2020      History/P.E. limitations: none  Adult vaccines due  Topic Date Due  . TETANUS/TDAP  Never done   There are no preventive care reminders to display for this patient.  Health Maintenance Due  Topic Date Due  . Hepatitis C Screening  Never done  . TETANUS/TDAP  Never done  . INFLUENZA VACCINE  01/26/2020     Chief Complaint  Patient presents with  . Penile drainage  . Anal Itching    Penile Discharge - History: Ps to [facility] health dept 12/23/19 for penile discharge - labs were not available.  Pt ps to UC with complaint odf DC, no DC noted on exam, tx's empirically with Rocephin IM and Azithromycin PO x 1. GC/Chl, Trich  labs negative.  Seen again of 12/28/19 again at [facility] for same complaint: Complete STD screening completed.  Tx'd with ?Moxifloxacin? that he reports being told was last line therapy. - Now, only very occasion thin, scant drainage. Not a concern.  Anal itching - Started recently - Evaluated at [facility] -  not clear that received treatment specifically for this complaint  PE Gental: circ'd, bilaterally desc testilce without masses nor tenderness, External anal skin without lesions, normal surrounding skin.   A/P Penile Discharge: resolved  - [facility] recent visits.  Pruritus Ani - Recommend thick emolient cream to area around anus to protect from irritants.   At risk for sexually transmitted disease due to partner with HIV - Practicing protected sexual relations - Not currently interested in PrEP

## 2020-02-09 ENCOUNTER — Encounter: Payer: Self-pay | Admitting: Family Medicine

## 2020-03-03 ENCOUNTER — Other Ambulatory Visit: Payer: Self-pay | Admitting: Family Medicine

## 2020-03-06 ENCOUNTER — Other Ambulatory Visit: Payer: Self-pay | Admitting: Family Medicine

## 2020-03-06 DIAGNOSIS — N342 Other urethritis: Secondary | ICD-10-CM

## 2020-03-06 MED ORDER — MOXIFLOXACIN HCL 400 MG PO TABS: 400.0000 mg | ORAL_TABLET | Freq: Every day | ORAL | 0 refills | Status: DC

## 2020-03-06 MED FILL — MOXIFLOXACIN HCL 400 MG TAB: 400 | 7 days supply | Qty: 7 | Fill #0

## 2020-03-25 ENCOUNTER — Telehealth: Payer: Self-pay | Admitting: Family Medicine

## 2020-03-25 ENCOUNTER — Telehealth: Payer: Self-pay | Admitting: Adult Health

## 2020-03-25 DIAGNOSIS — U071 COVID-19: Secondary | ICD-10-CM

## 2020-03-25 MED ORDER — BUDESONIDE 180 MCG/ACT IN AEPB: 2.0000 | INHALATION_SPRAY | Freq: Two times a day (BID) | RESPIRATORY_TRACT | 2 refills | Status: DC

## 2020-03-25 NOTE — Telephone Encounter (Signed)
D. McDiarmid is away.  Patient is vaccinated but tested positive for Covid.  He is currently early in his course of illness on day 3.  Reached out to monoclonal infusion group to see if you qualify for an infusion.  Patient is amenable to the infusion if he is a candidate.  Also sent prescription for budesonide to use twice a day to his pharmacy which is called outpatient pharmacy.  Terisa Starr, MD  Family Medicine Teaching Service

## 2020-03-25 NOTE — Telephone Encounter (Signed)
Called to Discuss with patient about Covid symptoms and the use of the monoclonal antibody infusion for those with mild to moderate Covid symptoms and at a high risk of hospitalization. Unable to reach, left message to call back [facility] hotline .   Kaylaann Mountz NP-C

## 2020-03-26 ENCOUNTER — Telehealth: Payer: Self-pay | Admitting: Internal Medicine

## 2020-03-26 ENCOUNTER — Ambulatory Visit (HOSPITAL_COMMUNITY)
Admission: RE | Admit: 2020-03-26 | Discharge: 2020-03-26 | Disposition: A | Discharge status: Home / Self Care | Payer: HRSA Program | Source: Ambulatory Visit | Attending: Family Medicine | Admitting: Family Medicine

## 2020-03-26 ENCOUNTER — Other Ambulatory Visit: Payer: Self-pay | Admitting: Internal Medicine

## 2020-03-26 ENCOUNTER — Encounter: Payer: Self-pay | Admitting: Internal Medicine

## 2020-03-26 DIAGNOSIS — J4530 Mild persistent asthma, uncomplicated: Secondary | ICD-10-CM | POA: Insufficient documentation

## 2020-03-26 DIAGNOSIS — U071 COVID-19: Secondary | ICD-10-CM

## 2020-03-26 MED ORDER — METHYLPREDNISOLONE SODIUM SUCC 125 MG IJ SOLR: 125.0000 mg | Freq: Once | INTRAMUSCULAR | Status: DC | PRN

## 2020-03-26 MED ORDER — ALBUTEROL SULFATE HFA 108 (90 BASE) MCG/ACT IN AERS: 2.0000 | INHALATION_SPRAY | Freq: Once | RESPIRATORY_TRACT | Status: DC | PRN

## 2020-03-26 MED ORDER — SODIUM CHLORIDE 0.9 % IV SOLN
1200.0000 mg | Freq: Once | INTRAVENOUS | Status: AC
  Administered 2020-03-26: 1200 mg via INTRAVENOUS

## 2020-03-26 MED ORDER — EPINEPHRINE 0.3 MG/0.3ML IJ SOAJ: 0.3000 mg | Freq: Once | INTRAMUSCULAR | Status: DC | PRN

## 2020-03-26 MED ORDER — DIPHENHYDRAMINE HCL 50 MG/ML IJ SOLN: 50.0000 mg | Freq: Once | INTRAMUSCULAR | Status: DC | PRN

## 2020-03-26 MED ORDER — SODIUM CHLORIDE 0.9 % IV SOLN: INTRAVENOUS | Status: DC | PRN

## 2020-03-26 MED ORDER — ACETAMINOPHEN 325 MG PO TABS
650.0000 mg | ORAL_TABLET | Freq: Four times a day (QID) | ORAL | Status: DC | PRN
  Administered 2020-03-26: 650 mg via ORAL
  Filled 2020-03-26: qty 2

## 2020-03-26 MED ORDER — FAMOTIDINE IN NACL 20-0.9 MG/50ML-% IV SOLN: 20.0000 mg | Freq: Once | INTRAVENOUS | Status: DC | PRN

## 2020-03-26 NOTE — Progress Notes (Signed)
I connected by phone with Samuel Townsend on 03/26/2020 at 11:06 AM to discuss the potential use of a new treatment for mild to moderate COVID-19 viral infection in non-hospitalized patients.  This patient is a 29 y.o. male that meets the FDA criteria for Emergency Use Authorization of COVID monoclonal antibody casirivimab/imdevimab or bamlanivimab/eteseviamb.  Has a (+) direct SARS-CoV-2 viral test result  Has mild or moderate COVID-19   Is NOT hospitalized due to COVID-19  Is within 10 days of symptom onset  Has at least one of the high risk factor(s) for progression to severe COVID-19 and/or hospitalization as defined in EUA.  Specific high risk criteria : Chronic Lung Disease   I have spoken and communicated the following to the patient or parent/caregiver regarding COVID monoclonal antibody treatment:  1. FDA has authorized the emergency use for the treatment of mild to moderate COVID-19 in adults and pediatric patients with positive results of direct SARS-CoV-2 viral testing who are 2 years of age and older weighing at least 40 kg, and who are at high risk for progressing to severe COVID-19 and/or hospitalization.  2. The significant known and potential risks and benefits of COVID monoclonal antibody, and the extent to which such potential risks and benefits are unknown.  3. Information on available alternative treatments and the risks and benefits of those alternatives, including clinical trials.  4. Patients treated with COVID monoclonal antibody should continue to self-isolate and use infection control measures (e.g., wear mask, isolate, social distance, avoid sharing personal items, clean and disinfect "high touch" surfaces, and frequent handwashing) according to CDC guidelines.   5. The patient or parent/caregiver has the option to accept or refuse COVID monoclonal antibody treatment.  After reviewing this information with the patient, the patient has agreed to receive one of  the available covid 19 monoclonal antibodies and will be provided an appropriate fact sheet prior to infusion. [name], MD 03/26/2020 11:06 AM

## 2020-03-26 NOTE — Discharge Instructions (Signed)

## 2020-03-26 NOTE — Progress Notes (Signed)
  Diagnosis: COVID-19  Physician: Dr Wright  Procedure: Covid Infusion Clinic Med: casirivimab\imdevimab infusion - Provided patient with casirivimab\imdevimab fact sheet for patients, parents and caregivers prior to infusion.  Complications: No immediate complications noted.  Discharge: Discharged home   Samuel Townsend 03/26/2020   

## 2020-03-26 NOTE — Telephone Encounter (Signed)
I called the patient, he is 29 years old, history of asthma, chronic cough, approximately 4 days ago his chronic cough got worse, he also developed chest congestion, sinus congestion, nausea, headache and fatigue. Rapid test at  [facility]  On 9/27 was +. Schedule for today at 4.30 pm

## 2020-03-26 NOTE — Progress Notes (Signed)
Patient had a fever prior to Antibody therapy  Tylenol given po fluid also offered during therapy  Post therapy Temp at 100.4 pt intructed to take Ibuprofen when fever persist.

## 2020-04-23 ENCOUNTER — Other Ambulatory Visit (HOSPITAL_COMMUNITY): Payer: Self-pay | Admitting: Family Medicine

## 2020-04-23 ENCOUNTER — Other Ambulatory Visit: Payer: Self-pay | Admitting: Family Medicine

## 2020-04-23 ENCOUNTER — Encounter: Payer: Self-pay | Admitting: Family Medicine

## 2020-04-23 DIAGNOSIS — N342 Other urethritis: Secondary | ICD-10-CM

## 2020-04-23 MED ORDER — MOXIFLOXACIN HCL 400 MG PO TABS: 400.0000 mg | ORAL_TABLET | Freq: Every day | ORAL | 0 refills | Status: DC

## 2020-04-23 MED FILL — MOXIFLOXACIN HCL 400 MG TAB: 400 | 7 days supply | Qty: 7 | Fill #0
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
## 2020-12-17 ENCOUNTER — Other Ambulatory Visit (HOSPITAL_COMMUNITY)
Admission: RE | Admit: 2020-12-17 | Discharge: 2020-12-17 | Disposition: A | Discharge status: Home / Self Care | Payer: No Typology Code available for payment source | Source: Ambulatory Visit | Attending: Family Medicine | Admitting: Family Medicine

## 2020-12-17 ENCOUNTER — Encounter: Payer: Self-pay | Admitting: Family Medicine

## 2020-12-17 ENCOUNTER — Other Ambulatory Visit: Payer: Self-pay

## 2020-12-17 ENCOUNTER — Ambulatory Visit (INDEPENDENT_AMBULATORY_CARE_PROVIDER_SITE_OTHER): Payer: Self-pay | Admitting: Family Medicine

## 2020-12-17 VITALS — BP 132/74 | HR 68 | Ht 75.0 in | Wt 160.6 lb

## 2020-12-17 DIAGNOSIS — Z113 Encounter for screening for infections with a predominantly sexual mode of transmission: Secondary | ICD-10-CM | POA: Diagnosis not present

## 2020-12-17 DIAGNOSIS — Z79899 Other long term (current) drug therapy: Secondary | ICD-10-CM

## 2020-12-17 DIAGNOSIS — Z9189 Other specified personal risk factors, not elsewhere classified: Secondary | ICD-10-CM

## 2020-12-17 NOTE — Patient Instructions (Addendum)
YOur vital signs are good.   Your weight is in a healthy range.  We will check the STI labs.  Have a great time in [place]!  More information about Pre-exposure Prophylaxis (PrEP) of HIV is available at the [facility] webpage: [email or web address]

## 2020-12-18 ENCOUNTER — Encounter: Payer: Self-pay | Admitting: Family Medicine

## 2020-12-18 LAB — URINE CYTOLOGY ANCILLARY ONLY
Chlamydia: NEGATIVE
Comment: NEGATIVE
Comment: NORMAL
Neisseria Gonorrhea: NEGATIVE

## 2020-12-18 LAB — RPR: RPR Ser Ql: NONREACTIVE

## 2020-12-18 LAB — HIV ANTIBODY (ROUTINE TESTING W REFLEX): HIV Screen 4th Generation wRfx: NONREACTIVE

## 2020-12-18 LAB — HEPATITIS C ANTIBODY: Hep C Virus Ab: <0.1 s/co ratio (ref 0.0–0.9)

## 2020-12-21 NOTE — Progress Notes (Signed)
Samuel Townsend is alone Sources of clinical information for visit is/are patient and past medical records. Nursing assessment for this office visit was reviewed with the patient for accuracy and revision.     Previous Report(s) Reviewed: office notes  Depression screen [facility] 2/9 12/17/2020  Decreased Interest 1  Down, Depressed, Hopeless 1  PHQ - 2 Score 2  Altered sleeping 3  Tired, decreased energy 1  Change in appetite 0  Feeling bad or failure about yourself  1  Trouble concentrating 2  Moving slowly or fidgety/restless 0  Suicidal thoughts 0  PHQ-9 Score 9  Difficult doing work/chores -    Fall Risk  12/17/2015  Falls in the past year? No    PHQ9 SCORE ONLY 12/17/2020 02/06/2020 08/08/2019  PHQ-9 Total Score 9 4 2     Adult vaccines due  Topic Date Due   TETANUS/TDAP  Never done    Health Maintenance Due  Topic Date Due   Pneumococcal Vaccine 13-10 Years old (1 - PCV) Never done   TETANUS/TDAP  Never done      History/P.E. limitations: none  Adult vaccines due  Topic Date Due   TETANUS/TDAP  Never done   There are no preventive care reminders to display for this patient.  Health Maintenance Due  Topic Date Due   Pneumococcal Vaccine 6-79 Years old (1 - PCV) Never done   TETANUS/TDAP  Never done     Chief Complaint  Patient presents with   Std testing

## 2020-12-21 NOTE — Assessment & Plan Note (Addendum)
Established problem Samuel Townsend is no longer in intimate relation with his previous partner with HIV. He is not currently sexually active.  He knows about PReP therapy for HIV prophylaxis.  Urine GC/Chlamydia, and serum RPR, HCV, and HIV tested

## 2021-11-25 ENCOUNTER — Encounter: Payer: Self-pay | Admitting: Family Medicine

## 2021-11-25 ENCOUNTER — Ambulatory Visit (INDEPENDENT_AMBULATORY_CARE_PROVIDER_SITE_OTHER): Payer: Self-pay | Admitting: Family Medicine

## 2021-11-25 ENCOUNTER — Other Ambulatory Visit: Payer: Self-pay

## 2021-11-25 ENCOUNTER — Other Ambulatory Visit (HOSPITAL_COMMUNITY)
Admission: RE | Admit: 2021-11-25 | Discharge: 2021-11-25 | Disposition: A | Discharge status: Home / Self Care | Payer: No Typology Code available for payment source | Source: Ambulatory Visit | Attending: Family Medicine | Admitting: Family Medicine

## 2021-11-25 VITALS — BP 126/60 | HR 69 | Ht 75.0 in | Wt 157.5 lb

## 2021-11-25 DIAGNOSIS — J45998 Other asthma: Secondary | ICD-10-CM

## 2021-11-25 DIAGNOSIS — Z9189 Other specified personal risk factors, not elsewhere classified: Secondary | ICD-10-CM | POA: Insufficient documentation

## 2021-11-25 DIAGNOSIS — R053 Chronic cough: Secondary | ICD-10-CM

## 2021-11-25 DIAGNOSIS — J302 Other seasonal allergic rhinitis: Secondary | ICD-10-CM

## 2021-11-25 DIAGNOSIS — Z7251 High risk heterosexual behavior: Secondary | ICD-10-CM

## 2021-11-25 DIAGNOSIS — R636 Underweight: Secondary | ICD-10-CM

## 2021-11-25 MED ORDER — FLUTICASONE PROPIONATE 50 MCG/ACT NA SUSP: 2.0000 | Freq: Every day | NASAL | 2 refills | Status: DC

## 2021-11-25 MED ORDER — FLUTICASONE FUROATE-VILANTEROL 100-25 MCG/ACT IN AEPB: 1.0000 | INHALATION_SPRAY | Freq: Every day | RESPIRATORY_TRACT | 2 refills | Status: AC

## 2021-11-25 MED ORDER — ALBUTEROL SULFATE HFA 108 (90 BASE) MCG/ACT IN AERS: 2.0000 | INHALATION_SPRAY | Freq: Four times a day (QID) | RESPIRATORY_TRACT | 2 refills | Status: AC | PRN

## 2021-11-25 NOTE — Patient Instructions (Addendum)
  It sounds like you have inflammation in your lungs, perhaps seasonal asthma.  Too decrease how much of your inhaler you need to use a day, please start Breo Ellipta inhaled once a day.  This is called a maintenance inhaler because it is suppose to maintain decreased inflammation in your lungs and decrease how often you need to use the rescue inhaler.    A referral to an Allergist was made.  Medication Adjustments/Labs and Tests Ordered: Stop these Medication(s):   Start these Medication(s):    Labs and Tests ordered today are listed in the Patient Instructions below.  Patient Instructions  Medication Instructions:   Your physician recommends that you continue on your current medications as directed except for the medication(s) listed above that you were instructed to stop or start.  Please refer to the Current Medication list given to you today.   Today's Testing/Procedures: HIV, Syphilis, Gonorrhea and Chlamydia          If you need a refill on your medications from the doctors at the [facility] before your next appointment, please call your pharmacy to request the refills from your doctor.

## 2021-11-26 ENCOUNTER — Telehealth: Payer: Self-pay

## 2021-11-26 DIAGNOSIS — R636 Underweight: Secondary | ICD-10-CM | POA: Insufficient documentation

## 2021-11-26 DIAGNOSIS — J302 Other seasonal allergic rhinitis: Secondary | ICD-10-CM | POA: Insufficient documentation

## 2021-11-26 DIAGNOSIS — R053 Chronic cough: Secondary | ICD-10-CM | POA: Insufficient documentation

## 2021-11-26 DIAGNOSIS — Z7251 High risk heterosexual behavior: Secondary | ICD-10-CM | POA: Insufficient documentation

## 2021-11-26 LAB — RPR: RPR Ser Ql: NONREACTIVE

## 2021-11-26 LAB — HIV ANTIBODY (ROUTINE TESTING W REFLEX): HIV Screen 4th Generation wRfx: NONREACTIVE

## 2021-11-26 LAB — URINE CYTOLOGY ANCILLARY ONLY
Chlamydia: NEGATIVE
Comment: NEGATIVE
Comment: NORMAL
Neisseria Gonorrhea: NEGATIVE

## 2021-11-26 MED ORDER — FLUTICASONE PROPIONATE 50 MCG/ACT NA SUSP: 2.0000 | Freq: Every day | NASAL | 2 refills | Status: AC

## 2021-11-26 NOTE — Assessment & Plan Note (Addendum)
New issue Patient seen 5/16 at [facility] in [place] for persistent cough with hemoptysis. No lung exam abnormalities.   Diagnosed with a an acute bronchitis.  Rx'd albuterol meter dosed inhaler prn and Prednisone taper. patient did not start prednisone.  He has been using his albuterol daily to several times a day.  He is using for the cough.  His cough responds to albuterol.  (+) nasal congestion Worse with talking.  Mr Marcoux talks on phone working in medication prior authorization.  He will start coughing fit while talking which interrupts him temporarily during phone conversations. He will use the albuterol meter dosed inhaler to treat the coughing fits. The albuterol helps suppress the coughing.  He feels as though there is phlegm deep in his chest which he is unable to get up with coughing. 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  No preceding head colds.  No rhinorrhea No nose or eye itching.  No sneezing No indigestion, no heartburn  No night time cough.  No use of albuterol at night.  No coughing while exercising daily at gym No shortness of breath.   No history of asthma. No history of eczema  Exam: (+) Dinne-Morgan lines, no conj injection  normal appearing anterior nostril mucosa bil No Cx LAN Lung: BCTA, no wheeze nor crackles, no acc mm use.  A/ Question of exertional or allergy-based coughing / asthma At risk of beta-agonist tachyphylaxis Uncertain insurance coverage of medication costs  P/ Appears that Mr Vigo prior insurance may be covering his meds currently.  Rx for Breo ellipta prescribed. Referral to Asthma/Allergy consultation for consideration of asthma diagnosis RTC 4 weeks to follow up response

## 2021-11-26 NOTE — Assessment & Plan Note (Signed)
Samuel Townsend is in a mutually monogamous relationship for last 6 months.  He is requesting testing for common STI Urine GC/Chlamdia pending RPR and HIV negative.

## 2021-11-26 NOTE — Assessment & Plan Note (Signed)
Established problem Uncontrolled.  Patient is not at goal of nasal congestion reduction and persistent cough. Start: Flonase NS Continue Xyzal. Referral Allergy consultation.

## 2021-11-26 NOTE — Telephone Encounter (Signed)
A Prior Authorization was initiated for this patients Flonase (fluticasone) through CoverMyMeds.   Key: [id number]

## 2021-11-26 NOTE — Assessment & Plan Note (Signed)
Prior issue for Samuel Townsend that appears to have resolved and not recurred.

## 2021-11-26 NOTE — Progress Notes (Signed)
Samuel Townsend is alone Sources of clinical information for visit is/are patient. Nursing assessment for this office visit was reviewed with the patient for accuracy and revision.     Previous Report(s) Reviewed: recent Urgent Care visit 11/09/21 note     11/25/2021    8:31 AM  Depression screen PHQ 2/9  Decreased Interest 0  Down, Depressed, Hopeless 0  PHQ - 2 Score 0  Altered sleeping 2  Tired, decreased energy 1  Change in appetite 0  Feeling bad or failure about yourself  1  Trouble concentrating 1  Moving slowly or fidgety/restless 0  Suicidal thoughts 0  PHQ-9 Score 5  Difficult doing work/chores Somewhat difficult   Flowsheet Row Office Visit from 11/25/2021 in [facility] Office Visit from 12/17/2020 in [facility] Office Visit from 02/06/2020 in [facility]  Thoughts that you would be better off dead, or of hurting yourself in some way Not at all Not at all Not at all  PHQ-9 Total Score 5 9 4           12/17/2015    5:17 PM  Fall Risk   Falls in the past year? No       11/25/2021    8:31 AM 12/17/2020   11:00 AM 02/06/2020    9:08 AM  PHQ9 SCORE ONLY  PHQ-9 Total Score 5 9 4     Adult vaccines due  Topic Date Due   TETANUS/TDAP  03/26/2022 (Originally 01/09/2012)   History/P.E. limitations: none  Adult vaccines due  Topic Date Due   TETANUS/TDAP  03/26/2022 (Originally 01/09/2012)   There are no preventive care reminders to display for this patient. There are no preventive care reminders to display for this patient.   Chief Complaint  Patient presents with   Annual Exam

## 2021-11-29 NOTE — Telephone Encounter (Signed)
PA NOT REQUIRED. Use NDC [id number] & get 16 grams per 30 days.  Patients pharmacy notified. Copay $21

## 2021-11-30 ENCOUNTER — Encounter: Payer: Self-pay | Admitting: *Deleted

## 2021-12-02 ENCOUNTER — Encounter: Payer: Self-pay | Admitting: Family Medicine

## 2021-12-03 ENCOUNTER — Encounter: Payer: Self-pay | Admitting: Family Medicine

## 2021-12-03 ENCOUNTER — Ambulatory Visit: Payer: No Typology Code available for payment source | Admitting: Family Medicine

## 2021-12-03 ENCOUNTER — Ambulatory Visit (INDEPENDENT_AMBULATORY_CARE_PROVIDER_SITE_OTHER): Payer: Self-pay | Admitting: Family Medicine

## 2021-12-03 VITALS — BP 130/74 | HR 62 | Ht 75.0 in | Wt 159.6 lb

## 2021-12-03 DIAGNOSIS — R202 Paresthesia of skin: Secondary | ICD-10-CM

## 2021-12-03 NOTE — Patient Instructions (Signed)
Paresthesia Paresthesia is a burning or prickling feeling. This feeling can happen in any part of the body. It often happens in the hands, arms, legs, or feet. Usually, it is not painful. In most cases, the feeling goes away in a short time and is not a sign of a serious problem. If you have paresthesia that lasts a long time, you need to see your doctor. Follow these instructions at home: Nutrition Eat a healthy diet. This includes: Eating foods that are high in fiber. These include beans, whole grains, and fresh fruits and vegetables. Limiting foods that are high in fat and sugar. These include fried or sweet foods.  Alcohol use  Do not drink alcohol if: Your doctor tells you not to drink. You are pregnant, may be pregnant, or are planning to become pregnant. If you drink alcohol: Limit how much you have to: 0-1 drink a day for women. 0-2 drinks a day for men. Know how much alcohol is in your drink. In the [place] one drink equals one 12 oz bottle of beer (355 mL), one 5 oz glass of wine (148 mL), or one 1 oz glass of hard liquor (44 mL). General instructions Take over-the-counter and prescription medicines only as told by your doctor. Do not smoke or use any products that contain nicotine or tobacco. If you need help quitting, ask your doctor. If you have diabetes, work with your doctor to make sure your blood sugar stays in a healthy range. If your feet feel numb: Check for redness, warmth, and swelling every day. Wear padded socks and comfortable shoes. These help protect your feet. Keep all follow-up visits. Contact a doctor if: You have paresthesia that gets worse or does not go away. You lose feeling (have numbness) after an injury. Your burning or prickling feeling gets worse when you walk. You have pain or cramps. You feel dizzy or you faint. You have a rash. Get help right away if: You feel weak or have new weakness in an arm or leg. You have trouble walking or  moving. You have problems speaking, understanding, or seeing. You feel confused. You cannot control when you pee (urinate) or poop (have a bowel movement). These symptoms may be an emergency. Get help right away. Call 911. Do not wait to see if the symptoms will go away. Do not drive yourself to the hospital. Summary Paresthesia is a burning or prickling feeling. It often happens in the hands, arms, legs, or feet. In most cases, the feeling goes away in a short time and is not a sign of a serious problem. If you have paresthesia that lasts a long time, you need to be seen by your doctor. This information is not intended to replace advice given to you by your health care provider. Make sure you discuss any questions you have with your health care provider. Document Revised: 02/22/2021 Document Reviewed: 02/22/2021 Elsevier Patient Education  2023 Elsevier Inc.  

## 2021-12-03 NOTE — Progress Notes (Signed)
    SUBJECTIVE:   CHIEF COMPLAINT / HPI:   Arm pain: He c/o an intermittent sharp, burning, and tingling pain in his right forearm that started after he had lab drawn 8 days ago. The pain shoots from his right elbow crease and shoots to his wrist with certain ROM. He denies swelling or redness. He is currently asymptomatic   PERTINENT  PMH / PSH: PMhx reviewed  OBJECTIVE:   BP 130/74   Pulse 62   Ht 6\' 3"  (1.905 m)   Wt 159 lb 9.6 oz (72.4 kg)   SpO2 100%   BMI 19.95 kg/m   Physical Exam Constitutional:      Appearance: Normal appearance.  Cardiovascular:     Rate and Rhythm: Normal rate and regular rhythm.     Heart sounds: Normal heart sounds.  Pulmonary:     Effort: Pulmonary effort is normal. No respiratory distress.     Breath sounds: Normal breath sounds. No wheezing.  Musculoskeletal:     Right forearm: No swelling, deformity, tenderness or bony tenderness.     Right wrist: No tenderness. Normal range of motion.     Left wrist: No tenderness. Normal range of motion.     Comments: Neg Phalen test  Neurological:     Cranial Nerves: Cranial nerves 2-12 are intact.     Sensory: Sensation is intact.     Motor: Motor function is intact.     Deep Tendon Reflexes: Reflexes are normal and symmetric.      ASSESSMENT/PLAN:  Nerve injury following venipuncture: Likely a radial nerve injury. The patient was reassured this might heal over a period of time - days to weeks. Consider Gabapentin and NSC/EMG in the future if there is no improvement. I have advised him to avoid triggers which, according to him, include heavy weight lifting. F/U instruction provided. May do a virtual visit in the future for this if needed. He agreed with the plan.         , MD [facility]

## 2021-12-04 ENCOUNTER — Encounter (HOSPITAL_BASED_OUTPATIENT_CLINIC_OR_DEPARTMENT_OTHER): Payer: Self-pay

## 2021-12-04 ENCOUNTER — Other Ambulatory Visit: Payer: Self-pay

## 2021-12-04 ENCOUNTER — Emergency Department (HOSPITAL_BASED_OUTPATIENT_CLINIC_OR_DEPARTMENT_OTHER)
Admission: EM | Admit: 2021-12-04 | Discharge: 2021-12-04 | Disposition: A | Discharge status: Home / Self Care | Payer: No Typology Code available for payment source | Attending: Emergency Medicine | Admitting: Emergency Medicine

## 2021-12-04 DIAGNOSIS — M79631 Pain in right forearm: Secondary | ICD-10-CM | POA: Insufficient documentation

## 2021-12-04 DIAGNOSIS — M79601 Pain in right arm: Secondary | ICD-10-CM | POA: Diagnosis present

## 2021-12-04 NOTE — Discharge Instructions (Signed)
As we discussed I do not see any signs of blood clot, infection, or other worrisome changes of the arm that I think could have been caused by her blood draw.  I am more suspicious that you may be had a small sprain or strain injury of the forearm muscles that he may not recall.  Less likely to be something like nerve irritation from your blood draw.  I recommend rest, compression of the affected extremity, you can apply some ice for 15 minutes at a time, and decrease physical activity to help with pain and symptoms until they improve.  You can return to normal activity as tolerated.  Although I have a low suspicion at this time if you do start to experience increased swelling, redness, especially if you develop shortness of breath, chest pain or recommend that you return for evaluation of a blood clot in the arm. Please use Tylenol or ibuprofen for pain.  You may use 600 mg ibuprofen every 6 hours or 1000 mg of Tylenol every 6 hours.  You may choose to alternate between the 2.  This would be most effective.  Not to exceed 4 g of Tylenol within 24 hours.  Not to exceed 3200 mg ibuprofen 24 hours.

## 2021-12-04 NOTE — ED Provider Notes (Signed)
[facility] EMERGENCY DEPT Provider Note   CSN: [id number] Arrival date & time: 12/04/21  1345     History  Chief Complaint  Patient presents with   Arm Pain    Samuel Townsend is a 31 y.o. male with no signet past medical history who presents with concern for right arm pain for around 1 week in the right forearm.  Patient reports that it started after he got some routine blood work done that day.  Patient reports that his distal to his [facility] where the blood was drawn, and blood draw was without any significant complications.  He has not noticed any increased redness, swelling, pus draining from his blood work site.  He he reports that he works out frequently and thinks that it also could be related to this.  He denies any numbness, tingling, he denies any radiation of the pain down into his hands.  He denies any pain at rest, and reports it is only exacerbated by physical activity, and holding arm at his side for a long period of time.  No previous history of blood clots, no shortness of breath, no chest pain.   Arm Pain       Home Medications Prior to Admission medications   Medication Sig Start Date End Date Taking? Authorizing Provider  albuterol (VENTOLIN HFA) 108 (90 Base) MCG/ACT inhaler Inhale 2 puffs into the lungs every 6 (six) hours as needed for wheezing or shortness of breath. Patient not taking: Reported on 12/03/2021 11/25/21   McDiarmid, Leighton Roach, MD  APPLE CIDER VINEGAR PO Take by mouth. gummies    [provider]  ASHWAGANDHA PO Take by mouth. gummies    [provider]  fluticasone (FLONASE) 50 MCG/ACT nasal spray Place 2 sprays into both nostrils daily. Patient not taking: Reported on 12/03/2021 11/26/21   McDiarmid, Leighton Roach, MD  fluticasone furoate-vilanterol (BREO ELLIPTA) 100-25 MCG/ACT AEPB Inhale 1 puff into the lungs daily. Patient not taking: Reported on 12/03/2021 11/25/21 02/23/22  McDiarmid, Leighton Roach, MD  levocetirizine (XYZAL) 5 MG tablet  Take 5 mg by mouth every evening.    [provider]  buPROPion (WELLBUTRIN XL) 150 MG 24 hr tablet Take 1 tablet (150 mg total) by mouth daily. 09/26/19 12/24/19  McDiarmid, Leighton Roach, MD  loratadine (CLARITIN) 10 MG tablet Take 10 mg by mouth daily.  12/24/19  [provider]      Allergies    Coconut flavor and Mushroom extract complex    Review of Systems   Review of Systems  All other systems reviewed and are negative.   Physical Exam Updated Vital Signs BP 129/78   Pulse 74   Temp 98.1 F (36.7 C)   Resp 16   Ht 6\' 3"  (1.905 m)   Wt 72.6 kg   SpO2 99%   BMI 20.00 kg/m  Physical Exam Vitals and nursing note reviewed.  Constitutional:      General: He is not in acute distress.    Appearance: Normal appearance.  HENT:     Head: Normocephalic and atraumatic.  Eyes:     General:        Right eye: No discharge.        Left eye: No discharge.  Cardiovascular:     Rate and Rhythm: Normal rate and regular rhythm.  Pulmonary:     Effort: Pulmonary effort is normal. No respiratory distress.  Musculoskeletal:        General: No deformity.  Comments: No significant tenderness palpation of the right forearm, no swelling, deformity, well-healed injection site at the right [facility] with no redness, purulent drainage.  Skin:    General: Skin is warm and dry.  Neurological:     Mental Status: He is alert and oriented to person, place, and time.  Psychiatric:        Mood and Affect: Mood normal.        Behavior: Behavior normal.     ED Results / Procedures / Treatments   Labs (all labs ordered are listed, but only abnormal results are displayed) Labs Reviewed - No data to display  EKG None  Radiology No results found.  Procedures Procedures    Medications Ordered in ED Medications - No data to display  ED Course/ Medical Decision Making/ A&P                           Medical Decision Making  This is an overall appearing 31 year old male who  presents with right forearm pain for around a week after blood draw.  Number differential diagnosis includes for muscle sprain or strain, thrombophlebitis, DVT, superficial venous thrombosis, versus other.  This is not exhaustive differential.  Physical exam I see no evidence of thrombophlebitis, or venous thrombosis, patient's pain seems consistent with likely musculoskeletal injury versus other.  Discussed do not see any worrisome or emergent characteristics at this time, would recommend ibuprofen, Tylenol, rest, ice, compression of the affected extremity and return to normal activity as tolerated.  Patient understands and agrees to plan, understands to return for worsening swelling, redness, shortness of breath, chest pain for DVT/superficial venous thrombosis evaluation.  Patient discharged in stable condition at this time. Final Clinical Impression(s) / ED Diagnoses Final diagnoses:  Pain of right upper extremity    Rx / DC Orders ED Discharge Orders     None         Olene Floss, PA-C 12/04/21 1418    Ernie Avena, MD 12/04/21 1531

## 2021-12-04 NOTE — ED Triage Notes (Signed)
Pt c/o pain in his right forearm for about 1 week. States pain started after getting some routine blood work done. No redness or swelling noted.

## 2023-03-16 ENCOUNTER — Emergency Department (HOSPITAL_BASED_OUTPATIENT_CLINIC_OR_DEPARTMENT_OTHER): Payer: Managed Care, Other (non HMO)

## 2023-03-16 ENCOUNTER — Encounter (HOSPITAL_BASED_OUTPATIENT_CLINIC_OR_DEPARTMENT_OTHER): Payer: Self-pay

## 2023-03-16 ENCOUNTER — Other Ambulatory Visit: Payer: Self-pay

## 2023-03-16 ENCOUNTER — Emergency Department (HOSPITAL_BASED_OUTPATIENT_CLINIC_OR_DEPARTMENT_OTHER)
Admission: EM | Admit: 2023-03-16 | Discharge: 2023-03-16 | Disposition: A | Discharge status: Home / Self Care | Payer: Managed Care, Other (non HMO) | Attending: Emergency Medicine | Admitting: Emergency Medicine

## 2023-03-16 DIAGNOSIS — W228XXA Striking against or struck by other objects, initial encounter: Secondary | ICD-10-CM | POA: Diagnosis not present

## 2023-03-16 DIAGNOSIS — S0093XA Contusion of unspecified part of head, initial encounter: Secondary | ICD-10-CM | POA: Insufficient documentation

## 2023-03-16 DIAGNOSIS — S0990XA Unspecified injury of head, initial encounter: Secondary | ICD-10-CM | POA: Diagnosis present

## 2023-03-16 DIAGNOSIS — R519 Headache, unspecified: Secondary | ICD-10-CM

## 2023-03-16 DIAGNOSIS — R112 Nausea with vomiting, unspecified: Secondary | ICD-10-CM

## 2023-03-16 LAB — BASIC METABOLIC PANEL
Anion gap: 8 (ref 5–15)
BUN: 16 mg/dL (ref 6–20)
CO2: 25 mmol/L (ref 22–32)
Calcium: 9.1 mg/dL (ref 8.9–10.3)
Chloride: 103 mmol/L (ref 98–111)
Creatinine, Ser: 1.26 mg/dL — ABNORMAL HIGH (ref 0.61–1.24)
GFR, Estimated: >60 mL/min (ref >60)
Glucose, Bld: 83 mg/dL (ref 70–99)
Potassium: 4.1 mmol/L (ref 3.5–5.1)
Sodium: 136 mmol/L (ref 135–145)

## 2023-03-16 LAB — CBC
HCT: 44.4 % (ref 39.0–52.0)
Hemoglobin: 15 g/dL (ref 13.0–17.0)
MCH: 27.9 pg (ref 26.0–34.0)
MCHC: 33.8 g/dL (ref 30.0–36.0)
MCV: 82.7 fL (ref 80.0–100.0)
Platelets: 245 10*3/uL (ref 150–400)
RBC: 5.37 MIL/uL (ref 4.22–5.81)
RDW: 12.8 % (ref 11.5–15.5)
WBC: 5.2 10*3/uL (ref 4.0–10.5)
nRBC: 0 % (ref 0.0–0.2)

## 2023-03-16 MED ORDER — ONDANSETRON 4 MG PO TBDP
8.0000 mg | ORAL_TABLET | Freq: Once | ORAL | Status: AC
  Administered 2023-03-16: 8 mg via ORAL
  Filled 2023-03-16: qty 2

## 2023-03-16 MED ORDER — ACETAMINOPHEN 500 MG PO TABS
1000.0000 mg | ORAL_TABLET | Freq: Once | ORAL | Status: AC
  Administered 2023-03-16: 1000 mg via ORAL
  Filled 2023-03-16: qty 2
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
## 2023-03-16 NOTE — ED Notes (Signed)
Dc instructions reviewed with patient. Patient voiced understanding. Dc with belongings.  °

## 2023-03-16 NOTE — Discharge Instructions (Signed)
It was our pleasure to provide your ER care today - we hope that you feel better.  Drink plenty of fluids/stay well hydrated. Take acetaminophen ibuprofen, or excedrin as need.   Follow up with primary care doctor in 1-2 weeks if symptoms fail to improve/resolve.  Return to ER if worse, new symptoms, fevers, persistent vomiting, new/severe pain, or other concern.

## 2023-04-12 ENCOUNTER — Other Ambulatory Visit (HOSPITAL_COMMUNITY): Payer: Self-pay

## 2023-04-12 MED ORDER — ALBUTEROL SULFATE HFA 108 (90 BASE) MCG/ACT IN AERS
2.0000 | INHALATION_SPRAY | RESPIRATORY_TRACT | 0 refills | Status: AC | PRN
  Filled 2023-04-12: qty 6.7, 25d supply, fill #0

## 2023-04-24 ENCOUNTER — Other Ambulatory Visit (HOSPITAL_COMMUNITY): Payer: Self-pay
